# Patient Record
Sex: Female | Born: 1955 | Race: Black or African American | Hispanic: No | Smoking: Current every day smoker
Health system: Southern US, Community
[De-identification: ages and names within clinical notes are randomized; demographics above are authoritative.]

## PROBLEM LIST (undated history)

## (undated) DIAGNOSIS — Z972 Presence of dental prosthetic device (complete) (partial): Secondary | ICD-10-CM

## (undated) DIAGNOSIS — G250 Essential tremor: Principal | ICD-10-CM

## (undated) DIAGNOSIS — H269 Unspecified cataract: Secondary | ICD-10-CM

## (undated) DIAGNOSIS — R251 Tremor, unspecified: Secondary | ICD-10-CM

## (undated) DIAGNOSIS — K08109 Complete loss of teeth, unspecified cause, unspecified class: Secondary | ICD-10-CM

## (undated) DIAGNOSIS — R2240 Localized swelling, mass and lump, unspecified lower limb: Secondary | ICD-10-CM

## (undated) DIAGNOSIS — F419 Anxiety disorder, unspecified: Secondary | ICD-10-CM

## (undated) DIAGNOSIS — F32A Depression, unspecified: Secondary | ICD-10-CM

## (undated) DIAGNOSIS — R05 Cough: Secondary | ICD-10-CM

## (undated) DIAGNOSIS — J45909 Unspecified asthma, uncomplicated: Secondary | ICD-10-CM

## (undated) DIAGNOSIS — G4730 Sleep apnea, unspecified: Secondary | ICD-10-CM

## (undated) DIAGNOSIS — G252 Other specified forms of tremor: Principal | ICD-10-CM

## (undated) DIAGNOSIS — E669 Obesity, unspecified: Secondary | ICD-10-CM

## (undated) DIAGNOSIS — I639 Cerebral infarction, unspecified: Secondary | ICD-10-CM

## (undated) DIAGNOSIS — M1990 Unspecified osteoarthritis, unspecified site: Secondary | ICD-10-CM

## (undated) DIAGNOSIS — R350 Frequency of micturition: Secondary | ICD-10-CM

## (undated) DIAGNOSIS — R0600 Dyspnea, unspecified: Secondary | ICD-10-CM

## (undated) DIAGNOSIS — N3281 Overactive bladder: Secondary | ICD-10-CM

## (undated) DIAGNOSIS — R413 Other amnesia: Secondary | ICD-10-CM

## (undated) HISTORY — DX: Other specified forms of tremor: G25.2

## (undated) HISTORY — DX: Anxiety disorder, unspecified: F41.9

## (undated) HISTORY — PX: JOINT REPLACEMENT: SHX530

## (undated) HISTORY — DX: Unspecified cataract: H26.9

## (undated) HISTORY — PX: OTHER SURGICAL HISTORY: SHX169

## (undated) HISTORY — DX: Depression, unspecified: F32.A

## (undated) HISTORY — DX: Other amnesia: R41.3

## (undated) HISTORY — DX: Obesity, unspecified: E66.9

## (undated) HISTORY — DX: Essential tremor: G25.0

## (undated) HISTORY — PX: IRIDOTOMY / IRIDECTOMY: SHX165

---

## 1980-04-16 HISTORY — PX: TUBAL LIGATION: SHX77

## 2010-12-16 ENCOUNTER — Emergency Department (HOSPITAL_COMMUNITY): Payer: Self-pay

## 2010-12-16 ENCOUNTER — Emergency Department (HOSPITAL_COMMUNITY)
Admission: EM | Admit: 2010-12-16 | Discharge: 2010-12-16 | Disposition: A | Discharge status: Home / Self Care | Payer: Self-pay | Attending: Emergency Medicine | Admitting: Emergency Medicine

## 2010-12-16 DIAGNOSIS — J45909 Unspecified asthma, uncomplicated: Secondary | ICD-10-CM | POA: Insufficient documentation

## 2010-12-16 DIAGNOSIS — F17200 Nicotine dependence, unspecified, uncomplicated: Secondary | ICD-10-CM | POA: Insufficient documentation

## 2010-12-16 DIAGNOSIS — R059 Cough, unspecified: Secondary | ICD-10-CM | POA: Insufficient documentation

## 2010-12-16 DIAGNOSIS — R05 Cough: Secondary | ICD-10-CM | POA: Insufficient documentation

## 2012-06-24 ENCOUNTER — Other Ambulatory Visit: Payer: Self-pay

## 2012-06-24 DIAGNOSIS — Z1231 Encounter for screening mammogram for malignant neoplasm of breast: Secondary | ICD-10-CM

## 2012-07-11 ENCOUNTER — Ambulatory Visit
Admission: RE | Admit: 2012-07-11 | Discharge: 2012-07-11 | Disposition: A | Discharge status: Home / Self Care | Payer: Self-pay | Source: Ambulatory Visit

## 2012-07-11 DIAGNOSIS — Z1231 Encounter for screening mammogram for malignant neoplasm of breast: Secondary | ICD-10-CM

## 2012-07-15 ENCOUNTER — Encounter (HOSPITAL_BASED_OUTPATIENT_CLINIC_OR_DEPARTMENT_OTHER): Payer: Self-pay | Admitting: *Deleted

## 2012-07-15 DIAGNOSIS — R059 Cough, unspecified: Secondary | ICD-10-CM

## 2012-07-15 DIAGNOSIS — R2240 Localized swelling, mass and lump, unspecified lower limb: Secondary | ICD-10-CM

## 2012-07-15 HISTORY — DX: Localized swelling, mass and lump, unspecified lower limb: R22.40

## 2012-07-15 HISTORY — DX: Cough, unspecified: R05.9

## 2012-07-21 ENCOUNTER — Encounter (HOSPITAL_BASED_OUTPATIENT_CLINIC_OR_DEPARTMENT_OTHER): Payer: Self-pay | Admitting: Anesthesiology

## 2012-07-21 ENCOUNTER — Encounter (HOSPITAL_BASED_OUTPATIENT_CLINIC_OR_DEPARTMENT_OTHER)
Admission: RE | Disposition: A | Discharge status: Home / Self Care | Payer: Self-pay | Source: Ambulatory Visit | Attending: Specialist

## 2012-07-21 ENCOUNTER — Ambulatory Visit (HOSPITAL_BASED_OUTPATIENT_CLINIC_OR_DEPARTMENT_OTHER): Payer: Medicaid Other | Admitting: Anesthesiology

## 2012-07-21 ENCOUNTER — Ambulatory Visit (HOSPITAL_BASED_OUTPATIENT_CLINIC_OR_DEPARTMENT_OTHER)
Admission: RE | Admit: 2012-07-21 | Discharge: 2012-07-21 | Disposition: A | Discharge status: Home / Self Care | Payer: Medicaid Other | Source: Ambulatory Visit | Attending: Specialist | Admitting: Specialist

## 2012-07-21 DIAGNOSIS — Z6841 Body Mass Index (BMI) 40.0 and over, adult: Secondary | ICD-10-CM | POA: Insufficient documentation

## 2012-07-21 DIAGNOSIS — J45909 Unspecified asthma, uncomplicated: Secondary | ICD-10-CM | POA: Insufficient documentation

## 2012-07-21 DIAGNOSIS — I69959 Hemiplegia and hemiparesis following unspecified cerebrovascular disease affecting unspecified side: Secondary | ICD-10-CM | POA: Insufficient documentation

## 2012-07-21 DIAGNOSIS — D1739 Benign lipomatous neoplasm of skin and subcutaneous tissue of other sites: Secondary | ICD-10-CM | POA: Insufficient documentation

## 2012-07-21 DIAGNOSIS — N318 Other neuromuscular dysfunction of bladder: Secondary | ICD-10-CM | POA: Insufficient documentation

## 2012-07-21 DIAGNOSIS — M129 Arthropathy, unspecified: Secondary | ICD-10-CM | POA: Insufficient documentation

## 2012-07-21 DIAGNOSIS — G709 Myoneural disorder, unspecified: Secondary | ICD-10-CM | POA: Insufficient documentation

## 2012-07-21 DIAGNOSIS — Z79899 Other long term (current) drug therapy: Secondary | ICD-10-CM | POA: Insufficient documentation

## 2012-07-21 DIAGNOSIS — Z791 Long term (current) use of non-steroidal anti-inflammatories (NSAID): Secondary | ICD-10-CM | POA: Insufficient documentation

## 2012-07-21 DIAGNOSIS — F17200 Nicotine dependence, unspecified, uncomplicated: Secondary | ICD-10-CM | POA: Insufficient documentation

## 2012-07-21 HISTORY — DX: Cerebral infarction, unspecified: I63.9

## 2012-07-21 HISTORY — DX: Unspecified osteoarthritis, unspecified site: M19.90

## 2012-07-21 HISTORY — DX: Overactive bladder: N32.81

## 2012-07-21 HISTORY — PX: MASS EXCISION: SHX2000

## 2012-07-21 HISTORY — DX: Tremor, unspecified: R25.1

## 2012-07-21 HISTORY — DX: Cough: R05

## 2012-07-21 HISTORY — DX: Frequency of micturition: R35.0

## 2012-07-21 HISTORY — DX: Presence of dental prosthetic device (complete) (partial): Z97.2

## 2012-07-21 HISTORY — DX: Complete loss of teeth, unspecified cause, unspecified class: K08.109

## 2012-07-21 HISTORY — DX: Unspecified asthma, uncomplicated: J45.909

## 2012-07-21 HISTORY — DX: Localized swelling, mass and lump, unspecified lower limb: R22.40

## 2012-07-21 LAB — POCT HEMOGLOBIN-HEMACUE: Hemoglobin: 13.2 g/dL (ref 12.0–15.0)

## 2012-07-21 SURGERY — EXCISION MASS
Anesthesia: General | Site: Knee | Laterality: Left | Wound class: Clean

## 2012-07-21 MED ORDER — FENTANYL CITRATE 0.05 MG/ML IJ SOLN: 50.0000 ug | INTRAMUSCULAR | Status: DC | PRN

## 2012-07-21 MED ORDER — CEFAZOLIN SODIUM-DEXTROSE 2-3 GM-% IV SOLR
2.0000 g | INTRAVENOUS | Status: AC
  Administered 2012-07-21: 2 g via INTRAVENOUS

## 2012-07-21 MED ORDER — DEXAMETHASONE SODIUM PHOSPHATE 4 MG/ML IJ SOLN
INTRAMUSCULAR | Status: DC | PRN
  Administered 2012-07-21: 5 mg via INTRAVENOUS

## 2012-07-21 MED ORDER — MIDAZOLAM HCL 2 MG/2ML IJ SOLN: 1.0000 mg | INTRAMUSCULAR | Status: DC | PRN

## 2012-07-21 MED ORDER — OXYCODONE HCL 5 MG PO TABS
5.0000 mg | ORAL_TABLET | Freq: Once | ORAL | Status: AC | PRN
  Administered 2012-07-21: 5 mg via ORAL

## 2012-07-21 MED ORDER — MIDAZOLAM HCL 2 MG/ML PO SYRP: 0.5000 mg/kg | ORAL_SOLUTION | Freq: Once | ORAL | Status: DC | PRN

## 2012-07-21 MED ORDER — LIDOCAINE HCL (CARDIAC) 20 MG/ML IV SOLN
INTRAVENOUS | Status: DC | PRN
  Administered 2012-07-21: 60 mg via INTRAVENOUS

## 2012-07-21 MED ORDER — ONDANSETRON HCL 4 MG/2ML IJ SOLN
INTRAMUSCULAR | Status: DC | PRN
  Administered 2012-07-21: 4 mg via INTRAVENOUS

## 2012-07-21 MED ORDER — SODIUM CHLORIDE 0.9 % IV SOLN
INTRAVENOUS | Status: DC | PRN
  Administered 2012-07-21: 200 mL via INTRAMUSCULAR

## 2012-07-21 MED ORDER — HYDROMORPHONE HCL PF 1 MG/ML IJ SOLN: 0.2500 mg | INTRAMUSCULAR | Status: DC | PRN

## 2012-07-21 MED ORDER — PROPOFOL 10 MG/ML IV BOLUS
INTRAVENOUS | Status: DC | PRN
  Administered 2012-07-21: 200 mg via INTRAVENOUS

## 2012-07-21 MED ORDER — FENTANYL CITRATE 0.05 MG/ML IJ SOLN
INTRAMUSCULAR | Status: DC | PRN
  Administered 2012-07-21: 100 ug via INTRAVENOUS
  Administered 2012-07-21: 50 ug via INTRAVENOUS

## 2012-07-21 MED ORDER — LACTATED RINGERS IV SOLN
INTRAVENOUS | Status: DC
  Administered 2012-07-21: 10:00:00 via INTRAVENOUS

## 2012-07-21 MED ORDER — LIDOCAINE-EPINEPHRINE 0.5 %-1:200000 IJ SOLN
INTRAMUSCULAR | Status: DC | PRN
  Administered 2012-07-21: 100 mL

## 2012-07-21 MED ORDER — OXYCODONE HCL 5 MG/5ML PO SOLN: 5.0000 mg | Freq: Once | ORAL | Status: AC | PRN

## 2012-07-21 MED ORDER — MIDAZOLAM HCL 5 MG/5ML IJ SOLN
INTRAMUSCULAR | Status: DC | PRN
  Administered 2012-07-21: 2 mg via INTRAVENOUS

## 2012-07-21 SURGICAL SUPPLY — 61 items
APL SKNCLS STERI-STRIP NONHPOA (GAUZE/BANDAGES/DRESSINGS)
BAG DECANTER FOR FLEXI CONT (MISCELLANEOUS) ×2 IMPLANT
BALL CTTN LRG ABS STRL LF (GAUZE/BANDAGES/DRESSINGS)
BANDAGE ELASTIC 4 VELCRO ST LF (GAUZE/BANDAGES/DRESSINGS) IMPLANT
BANDAGE ELASTIC 6 VELCRO ST LF (GAUZE/BANDAGES/DRESSINGS) ×1 IMPLANT
BANDAGE GAUZE ELAST BULKY 4 IN (GAUZE/BANDAGES/DRESSINGS) ×2 IMPLANT
BENZOIN TINCTURE PRP APPL 2/3 (GAUZE/BANDAGES/DRESSINGS) IMPLANT
BLADE KNIFE PERSONA 10 (BLADE) ×2 IMPLANT
BLADE KNIFE PERSONA 15 (BLADE) ×2 IMPLANT
BNDG COHESIVE 4X5 TAN STRL (GAUZE/BANDAGES/DRESSINGS) IMPLANT
CANISTER SUCTION 1200CC (MISCELLANEOUS) ×1 IMPLANT
CLEANER CAUTERY TIP 5X5 PAD (MISCELLANEOUS) ×1 IMPLANT
CLOTH BEACON ORANGE TIMEOUT ST (SAFETY) ×2 IMPLANT
COTTONBALL LRG STERILE PKG (GAUZE/BANDAGES/DRESSINGS) IMPLANT
COVER MAYO STAND STRL (DRAPES) ×2 IMPLANT
COVER TABLE BACK 60X90 (DRAPES) ×2 IMPLANT
DRAPE LAPAROSCOPIC ABDOMINAL (DRAPES) ×1 IMPLANT
DRAPE U-SHAPE 76X120 STRL (DRAPES) IMPLANT
DRSG PAD ABDOMINAL 8X10 ST (GAUZE/BANDAGES/DRESSINGS) IMPLANT
ELECT NDL TIP 2.8 STRL (NEEDLE) ×1 IMPLANT
ELECT NEEDLE TIP 2.8 STRL (NEEDLE) ×2 IMPLANT
ELECT REM PT RETURN 9FT ADLT (ELECTROSURGICAL) ×2
ELECTRODE REM PT RTRN 9FT ADLT (ELECTROSURGICAL) ×1 IMPLANT
FILTER 7/8 IN (FILTER) IMPLANT
GAUZE SPONGE 4X4 12PLY STRL LF (GAUZE/BANDAGES/DRESSINGS) IMPLANT
GAUZE SPONGE 4X4 16PLY XRAY LF (GAUZE/BANDAGES/DRESSINGS) IMPLANT
GAUZE XEROFORM 1X8 LF (GAUZE/BANDAGES/DRESSINGS) ×2 IMPLANT
GAUZE XEROFORM 5X9 LF (GAUZE/BANDAGES/DRESSINGS) IMPLANT
GLOVE BIO SURGEON STRL SZ 6.5 (GLOVE) ×2 IMPLANT
GLOVE BIOGEL M STRL SZ7.5 (GLOVE) ×2 IMPLANT
GLOVE BIOGEL PI IND STRL 8 (GLOVE) ×1 IMPLANT
GLOVE BIOGEL PI INDICATOR 8 (GLOVE) ×1
GLOVE ECLIPSE 7.0 STRL STRAW (GLOVE) ×2 IMPLANT
GOWN PREVENTION PLUS XLARGE (GOWN DISPOSABLE) IMPLANT
GOWN PREVENTION PLUS XXLARGE (GOWN DISPOSABLE) ×3 IMPLANT
NDL HYPO 25X1 1.5 SAFETY (NEEDLE) ×1 IMPLANT
NEEDLE HYPO 25X1 1.5 SAFETY (NEEDLE) ×2 IMPLANT
PACK BASIN DAY SURGERY FS (CUSTOM PROCEDURE TRAY) ×2 IMPLANT
PAD CLEANER CAUTERY TIP 5X5 (MISCELLANEOUS)
PENCIL BUTTON HOLSTER BLD 10FT (ELECTRODE) ×1 IMPLANT
SET ASPIRATION TUBING (TUBING) ×1 IMPLANT
SHEET MEDIUM DRAPE 40X70 STRL (DRAPES) ×1 IMPLANT
SHEETING SILICONE GEL EPI DERM (MISCELLANEOUS) IMPLANT
SPONGE GAUZE 4X4 12PLY (GAUZE/BANDAGES/DRESSINGS) IMPLANT
SPONGE LAP 18X18 X RAY DECT (DISPOSABLE) ×2 IMPLANT
STAPLER VISISTAT 35W (STAPLE) IMPLANT
STOCKINETTE 4X48 STRL (DRAPES) IMPLANT
STRIP CLOSURE SKIN 1/2X4 (GAUZE/BANDAGES/DRESSINGS) ×1 IMPLANT
SUCTION FRAZIER TIP 10 FR DISP (SUCTIONS) IMPLANT
SUT MNCRL AB 3-0 PS2 18 (SUTURE) IMPLANT
SUT PROLENE 4 0 P 3 18 (SUTURE) IMPLANT
SUT PROLENE 4 0 PS 2 18 (SUTURE) IMPLANT
SUT SILK 3 0 PS 1 (SUTURE) IMPLANT
SYR CONTROL 10ML LL (SYRINGE) ×2 IMPLANT
TAPE HYPAFIX 6X30 (GAUZE/BANDAGES/DRESSINGS) IMPLANT
TOWEL OR 17X24 6PK STRL BLUE (TOWEL DISPOSABLE) ×4 IMPLANT
TRAY DSU PREP LF (CUSTOM PROCEDURE TRAY) ×2 IMPLANT
TUBE CONNECTING 20X1/4 (TUBING) ×1 IMPLANT
UNDERPAD 30X30 INCONTINENT (UNDERPADS AND DIAPERS) ×1 IMPLANT
VAC PENCILS W/TUBING CLEAR (MISCELLANEOUS) IMPLANT
YANKAUER SUCT BULB TIP NO VENT (SUCTIONS) ×2 IMPLANT

## 2012-07-21 NOTE — Anesthesia Preprocedure Evaluation (Addendum)
Anesthesia Evaluation  Patient identified by MRN, date of birth, ID band Patient awake    Reviewed: Allergy & Precautions, H&P , NPO status , Patient's Chart, lab work & pertinent test results  Airway Mallampati: II TM Distance: >3 FB Neck ROM: Full    Dental no notable dental hx. (+) Edentulous Upper, Edentulous Lower and Dental Advisory Given   Pulmonary asthma ,  breath sounds clear to auscultation  Pulmonary exam normal       Cardiovascular negative cardio ROS  Rhythm:Regular Rate:Normal     Neuro/Psych  Neuromuscular disease CVA, Residual Symptoms negative psych ROS   GI/Hepatic negative GI ROS, Neg liver ROS,   Endo/Other  negative endocrine ROSMorbid obesity  Renal/GU negative Renal ROS  negative genitourinary   Musculoskeletal   Abdominal   Peds  Hematology negative hematology ROS (+)   Anesthesia Other Findings   Reproductive/Obstetrics negative OB ROS                          Anesthesia Physical Anesthesia Plan  ASA: III  Anesthesia Plan: General   Post-op Pain Management:    Induction: Intravenous  Airway Management Planned: LMA  Additional Equipment:   Intra-op Plan:   Post-operative Plan: Extubation in OR  Informed Consent: I have reviewed the patients History and Physical, chart, labs and discussed the procedure including the risks, benefits and alternatives for the proposed anesthesia with the patient or authorized representative who has indicated his/her understanding and acceptance.   Dental advisory given  Plan Discussed with: CRNA  Anesthesia Plan Comments:         Anesthesia Quick Evaluation

## 2012-07-21 NOTE — Anesthesia Procedure Notes (Signed)
Procedure Name: LMA Insertion Performed by: Muzammil Bruins W Pre-anesthesia Checklist: Patient identified, Timeout performed, Emergency Drugs available, Suction available and Patient being monitored Patient Re-evaluated:Patient Re-evaluated prior to inductionOxygen Delivery Method: Circle system utilized Preoxygenation: Pre-oxygenation with 100% oxygen Intubation Type: IV induction Ventilation: Mask ventilation without difficulty LMA: LMA with gastric port inserted LMA Size: 4.0 Number of attempts: 1 Placement Confirmation: breath sounds checked- equal and bilateral and positive ETCO2 Tube secured with: Tape Dental Injury: Teeth and Oropharynx as per pre-operative assessment      

## 2012-07-21 NOTE — Anesthesia Postprocedure Evaluation (Signed)
  Anesthesia Post-op Note  Patient: Jasmine Estrada  Procedure(s) Performed: Procedure(s): EXCISION OF LARGE MASS LEFT KNEE WITH LIPOSUCTION ASSISTED (Left)  Patient Location: PACU  Anesthesia Type:General  Level of Consciousness: awake and alert   Airway and Oxygen Therapy: Patient Spontanous Breathing  Post-op Pain: mild  Post-op Assessment: Post-op Vital signs reviewed, Patient's Cardiovascular Status Stable, Respiratory Function Stable, Patent Airway and No signs of Nausea or vomiting  Post-op Vital Signs: Reviewed and stable  Complications: No apparent anesthesia complications

## 2012-07-21 NOTE — Discharge Instructions (Signed)
Activity (include date of return to work if known) °As tolerated: NO showers °NO driving °No heavy activities ° °Diet:regular No restrictions: ° °Wound Care: Keep dressing clean & dry ° °Don not change dressings °For Abdominoplasties wear abdominal binder °Special Instructions: °Do not raise arms up °Continue to empty, recharge, & record drainage from J-P drains &/or °Hemovacs 2-3 times a day, as needed. °Call Doctor if any unusual problems occur such as pain, excessive °Bleeding, unrelieved Nausea/vomiting, Fever &/or chills °When lying down, keep head elevated on 2-3 pillows or back-rest °For Addominoplasties the Jack-knife position °Follow-up appointment: Please call the office. ° °The patient received discharge instruction from:___________________________________________ ° ° ° ° ° °Patient signature ________________________________________ / Date___________ ° ° ° °Signature of individual providing instructions/ Date________________             ° ° °Post Anesthesia Home Care Instructions ° °Activity: °Get plenty of rest for the remainder of the day. A responsible adult should stay with you for 24 hours following the procedure.  °For the next 24 hours, DO NOT: °-Drive a car °-Operate machinery °-Drink alcoholic beverages °-Take any medication unless instructed by your physician °-Make any legal decisions or sign important papers. ° °Meals: °Start with liquid foods such as gelatin or soup. Progress to regular foods as tolerated. Avoid greasy, spicy, heavy foods. If nausea and/or vomiting occur, drink only clear liquids until the nausea and/or vomiting subsides. Call your physician if vomiting continues. ° °Special Instructions/Symptoms: °Your throat may feel dry or sore from the anesthesia or the breathing tube placed in your throat during surgery. If this causes discomfort, gargle with warm salt water. The discomfort should disappear within 24 hours. ° °

## 2012-07-21 NOTE — Brief Op Note (Signed)
07/21/2012  11:21 AM  PATIENT:  Jasmine Estrada  57 y.o. female  PRE-OPERATIVE DIAGNOSIS:  LARGE MASS LEFT KNEE  POST-OPERATIVE DIAGNOSIS:  LARGE MASS LEFT KNEE  PROCEDURE:  Procedure(s): EXCISION OF LARGE MASS LEFT KNEE WITH LIPOSUCTION ASSISTED (Left)  SURGEON:  Surgeon(s) and Role:    * Louisa Second, MD - Primary  PHYSICIAN ASSISTANT:   ASSISTANTS: none   ANESTHESIA:   general  EBL:  Total I/O In: 1000 [I.V.:1000] Out: -   BLOOD ADMINISTERED:none  DRAINS: none   LOCAL MEDICATIONS USED:  LIDOCAINE   SPECIMEN:  Excision  DISPOSITION OF SPECIMEN:  PATHOLOGY  COUNTS:  YES  TOURNIQUET:  * No tourniquets in log *  DICTATION: .Other Dictation: Dictation Number [id number]  PLAN OF CARE: Discharge to home after PACU  PATIENT DISPOSITION:  PACU - hemodynamically stable.   Delay start of Pharmacological VTE agent (>24hrs) due to surgical blood loss or risk of bleeding: yes

## 2012-07-21 NOTE — Transfer of Care (Signed)
Immediate Anesthesia Transfer of Care Note  Patient: Jasmine Estrada  Procedure(s) Performed: Procedure(s): EXCISION OF LARGE MASS LEFT KNEE WITH LIPOSUCTION ASSISTED (Left)  Patient Location: PACU  Anesthesia Type:General  Level of Consciousness: awake, alert  and oriented  Airway & Oxygen Therapy: Patient Spontanous Breathing and Patient connected to face mask oxygen  Post-op Assessment: Report given to PACU RN and Post -op Vital signs reviewed and stable  Post vital signs: Reviewed and stable  Complications: No apparent anesthesia complications

## 2012-07-21 NOTE — H&P (Signed)
Jasmine Estrada is an 57 y.o. female.   Chief Complaint: Large mass left thigh HPI: Increased growth and limitation of rom  Past Medical History  Diagnosis Date  . Stroke     right sided-weakness  . Urinary frequency   . Arthritis     legs  . Cough 07/15/2012  . Full dentures   . Tremor     right foot  . Overactive bladder     states suspicion of bladder cancer  . Mass of knee 07/2012    left  . Asthma     prn inhaler    Past Surgical History  Procedure Laterality Date  . Tubal ligation  1982    History reviewed. No pertinent family history. Social History:  reports that she has been smoking Cigarettes.  She has been smoking about 0.00 packs per day for the past 44 years. She has never used smokeless tobacco. She reports that she does not drink alcohol or use illicit drugs.  Allergies: No Known Allergies  Medications Prior to Admission  Medication Sig Dispense Refill  . Aspirin-Acetaminophen-Caffeine (GOODY HEADACHE PO) Take by mouth.      . fesoterodine (TOVIAZ) 8 MG TB24 Take 8 mg by mouth daily.      Marland Kitchen albuterol (PROVENTIL HFA;VENTOLIN HFA) 108 (90 BASE) MCG/ACT inhaler Inhale 2 puffs into the lungs every 6 (six) hours as needed for wheezing.        No results found for this or any previous visit (from the past 48 hour(s)). No results found.  Review of Systems  Constitutional: Negative.   HENT: Negative.   Eyes: Negative.   Respiratory: Negative.   Cardiovascular: Negative.   Gastrointestinal: Negative.   Genitourinary: Negative.   Musculoskeletal: Negative.   Skin: Negative.   Neurological: Negative.   Endo/Heme/Allergies: Negative.   Psychiatric/Behavioral: Negative.     Blood pressure 131/87, pulse 87, temperature 98.6 F (37 C), temperature source Oral, resp. rate 20, height 5\' 7"  (1.702 m), weight 116.178 kg (256 lb 2 oz), SpO2 97.00%. Physical Exam   Assessment/Plan Largr mass left thigh with increased growth  Renji Berwick L 07/21/2012, 10:25  AM

## 2012-07-22 ENCOUNTER — Encounter (HOSPITAL_BASED_OUTPATIENT_CLINIC_OR_DEPARTMENT_OTHER): Payer: Self-pay | Admitting: Specialist

## 2012-07-23 NOTE — Op Note (Signed)
NAMEMONA, AYARS NO.:  [id number]  MEDICAL RECORD NO.:  [id number]  LOCATION:                                 FACILITY:  PHYSICIAN:  Earvin Hansen L. Shon Hough, M.D.DATE OF BIRTH:  [birth year]  DATE OF PROCEDURE:  07/21/2012 DATE OF DISCHARGE:  07/21/2012                              OPERATIVE REPORT   A 57 year old lady who is having a large mass about her left and in the thigh area, increased pain, discomfort, and decreased mobility.  The patient's area is roughly 12 x 10 inches, and she has increased rubbing and intertriginous changes secondary to the mass effect.  PROCEDURES DONE:  Excision, plaster reconstruction of the area with flap.  ANESTHESIA:  General.  Preoperatively, the patient underwent drawings for the mass on the left inner thigh knee area medially.  She underwent general anesthesia, intubated orally, prep was done to the leg, thigh areas with Hibiclens soap and solution, walled off with sterile towels and drapes so as to make a sterile field.  Curvilinear incision was made over the mass, carried down to the skin and subcutaneous tissue using Metzenbaum scissors and dissecting scissors.  We were able to dissect out the mass effect, which was very large down to the underlying musculature fascia. Liposuction was then fashioned to smooth out the edges and to improve contour of the area and then the flaps were rotated and advanced, secured with deep sutures of 2-0 Monocryl x2 layers, subdermal suture of 3-0 Monocryl and a running subcuticular stitch of 3-0 Monocryl.  End of the incision left open for drainage.  Sterile dressing were applied including 4x4s, Steri-Strips, ABDs, Hypafix tape, Ace wraps.  She withstood the procedures very well, was taken to recovery in good condition.  Estimated blood loss less than 100 mL.  Complications none.     Yaakov Guthrie. Shon Hough, M.D.     Cathie Hoops  D:  07/21/2012  T:  07/22/2012  Job:  [id number]

## 2013-01-23 ENCOUNTER — Encounter: Payer: Self-pay | Admitting: Neurology

## 2013-01-26 ENCOUNTER — Encounter: Payer: Self-pay | Admitting: Neurology

## 2013-01-26 ENCOUNTER — Ambulatory Visit (INDEPENDENT_AMBULATORY_CARE_PROVIDER_SITE_OTHER): Payer: Medicaid Other | Admitting: Neurology

## 2013-01-26 VITALS — BP 117/77 | HR 90 | Ht 66.0 in | Wt 244.0 lb

## 2013-01-26 DIAGNOSIS — G250 Essential tremor: Secondary | ICD-10-CM

## 2013-01-26 DIAGNOSIS — G252 Other specified forms of tremor: Secondary | ICD-10-CM | POA: Insufficient documentation

## 2013-01-26 DIAGNOSIS — R413 Other amnesia: Secondary | ICD-10-CM

## 2013-01-26 HISTORY — DX: Other amnesia: R41.3

## 2013-01-26 HISTORY — DX: Essential tremor: G25.0

## 2013-01-26 NOTE — Progress Notes (Signed)
Reason for visit: Tremor  Jasmine Estrada is a 57 y.o. female  History of present illness:  Ms. Jasmine Estrada is a 64 year old right-handed black female with a 14 year history of tremors. The patient indicates that the tremors mainly involve her right lower extremity, but there is also an associated discomfort that affects the leg that is present at all times. The patient may have an achy sensation, and a crawling sensation on the leg. The patient walks on her heel on the right leg, and she indicates that she has done this "all of her life". The patient has had tremors involving the upper extremities as well, but over the last 2 months, the tremors have gone away. The patient has been told that she had a stroke approximately a year ago. The patient uses a cane for ambulation, and she will occasionally use a walker. The patient has had some urinary incontinence over the last one year, and some memory problems that have developed over the last 2 years. The patient reports no numbness of the extremities, and she feels "weak all over" with fatigue. The patient reports no headaches or visual field changes. The patient denies problems with speech or swallowing. The patient is sent to this office for an evaluation of the tremor.  Past Medical History  Diagnosis Date  . Stroke     right sided-weakness  . Urinary frequency   . Arthritis     legs  . Cough 07/15/2012  . Full dentures   . Tremor     right foot  . Overactive bladder     states suspicion of bladder cancer  . Mass of knee 07/2012    left  . Asthma     prn inhaler  . Essential and other specified forms of tremor 01/26/2013  . Memory difficulty 01/26/2013  . Obesity     Past Surgical History  Procedure Laterality Date  . Tubal ligation  1982  . Mass excision Left 07/21/2012    Procedure: EXCISION OF LARGE MASS LEFT KNEE WITH LIPOSUCTION ASSISTED;  Surgeon: Louisa Second, MD;  Location: [facility];  Service: Plastics;   Laterality: Left;    Family History  Problem Relation Age of Onset  . Cancer Sister   . Diabetes Sister   . Mental illness Brother   . Alzheimer's disease Maternal Aunt   . Alzheimer's disease Maternal Grandmother   . Mental illness Sister     Social history:  reports that she has been smoking Cigarettes.  She has a 22 pack-year smoking history. She has never used smokeless tobacco. She reports that she does not drink alcohol or use illicit drugs.  Medications:  Current Outpatient Prescriptions on File Prior to Visit  Medication Sig Dispense Refill  . albuterol (PROVENTIL HFA;VENTOLIN HFA) 108 (90 BASE) MCG/ACT inhaler Inhale 2 puffs into the lungs every 6 (six) hours as needed for wheezing.      . solifenacin (VESICARE) 5 MG tablet Take 10 mg by mouth daily.       No current facility-administered medications on file prior to visit.     No Known Allergies  ROS:  Out of a complete 14 system review of symptoms, the patient complains only of the following symptoms, and all other reviewed systems are negative.  Fatigue Cough Incontinence of bladder Easy bruising Memory loss, tremor Restless legs Achy muscles  Blood pressure 117/77, pulse 90, height 5\' 6"  (1.676 m), weight 244 lb (110.678 kg).  Physical Exam  General:  The patient is alert and cooperative at the time of the examination. The patient is moderately obese.  Head: Pupils are equal, round, and reactive to light. Discs are flat bilaterally.  Neck: The neck is supple, no carotid bruits are noted.  Respiratory: The respiratory examination is clear.  Cardiovascular: The cardiovascular examination reveals a regular rate and rhythm, no obvious murmurs or rubs are noted.  Skin: Extremities are without significant edema.  Neurologic Exam  Mental status:  Cranial nerves: Facial symmetry is present. There is good sensation of the face to pinprick and soft touch bilaterally. The strength of the facial muscles and  the muscles to head turning and shoulder shrug are normal bilaterally. Speech is well enunciated, no aphasia or dysarthria is noted. Extraocular movements are full. Visual fields are full.  Motor: The motor testing reveals 5 over 5 strength of all 4 extremities. Good symmetric motor tone is noted throughout.  Sensory: Sensory testing is intact to pinprick, soft touch, vibration sensation, and position sense on all 4 extremities, with the exception that pinprick sensation is decreased on the right arm and right leg. No evidence of extinction is noted.  Coordination: Cerebellar testing reveals good finger-nose-finger and heel-to-shin bilaterally. The patient does have tremors affecting the fourth and fifth toes of the right foot, mild tremors of the fingers of the hands, right greater than left, and an intermittent adductor tremor involving the legs.  Gait and station: Gait is associated with a limping type gait, the patient walks on heel on the right foot. The patient uses a cane for ambulation. Tandem gait is slightly unsteady. Romberg is negative. No drift is seen.  Reflexes: Deep tendon reflexes are symmetric and normal bilaterally. Toes are downgoing bilaterally.   Assessment/Plan:  1. Tremor  2. Gait disorder  3. Memory disorder  The patient appears to have an unusual tremor that dates back 14 years. The patient does have mild tremors mainly affecting the fingers of the hands, right greater than left, and of the lateral toes of the right foot, with an adductor tremor of the legs. The patient reports a progressive memory problem as well. The patient walks with a cane. The patient will be set up for blood work today, and she will undergo MRI evaluation of the brain and an EEG study. A second opinion from a movement disorder specialist may be indicated. Both of this patient's parents died early, there is no family history of tremor. The possibility of Huntington's disease or Wilson's disease  should be considered.  Marlan Palau MD 01/26/2013 7:30 PM  [facility] [address] [place], [place] [address]  Phone [phone] Fax [phone]

## 2013-02-16 ENCOUNTER — Telehealth: Payer: Self-pay | Admitting: Neurology

## 2013-02-16 NOTE — Telephone Encounter (Signed)
Called and left VM message for clarification of paperwork

## 2013-02-17 NOTE — Telephone Encounter (Signed)
I called the patient. I left a message. I do not understand her message about canceling EEG. I will contact her once the MRI the brain is done.

## 2013-02-18 ENCOUNTER — Other Ambulatory Visit: Payer: Medicaid Other

## 2013-02-25 ENCOUNTER — Telehealth: Payer: Self-pay | Admitting: *Deleted

## 2013-02-25 DIAGNOSIS — Z79899 Other long term (current) drug therapy: Secondary | ICD-10-CM

## 2013-02-25 NOTE — Telephone Encounter (Signed)
Spoke with patient and she was unaware of the blood work order, had not been scheduled for the EEG, MRI was denied but per Renee(insurance needed labs) patient will come in today and have labs, EEG will be scheduled

## 2013-02-25 NOTE — Telephone Encounter (Signed)
Pt states cannot get pharmacy to fill her Lamisil. I informed pt that she needed to have another Hepatic Panel bloodwork.  Pt agreed to pick up the lab orders on 02/26/2013 and have it drawn at her Dr Anne Hahn office.

## 2013-02-26 ENCOUNTER — Other Ambulatory Visit: Payer: Medicaid Other

## 2013-02-27 ENCOUNTER — Telehealth: Payer: Self-pay | Admitting: Neurology

## 2013-03-03 ENCOUNTER — Telehealth: Payer: Self-pay | Admitting: *Deleted

## 2013-03-03 NOTE — Telephone Encounter (Addendum)
Pt states unable to have bloodwork drawn, because she was told that she did not have Medicaid coverage this month.  Pt is to have hepatic function panel for Lamisil therapy.  Contacted pt 03/04/2013, informed that Dr Al Corpus stated continue Lamisil and get labs drawn next month.  Pt states understanding.

## 2013-03-03 NOTE — Telephone Encounter (Signed)
Call her and have it performed next month if she wants to continue with lamisil

## 2013-04-02 ENCOUNTER — Other Ambulatory Visit (INDEPENDENT_AMBULATORY_CARE_PROVIDER_SITE_OTHER): Payer: Self-pay

## 2013-04-02 DIAGNOSIS — Z0289 Encounter for other administrative examinations: Secondary | ICD-10-CM

## 2013-04-02 LAB — HEPATIC FUNCTION PANEL
ALT: 14 U/L (ref 0–35)
AST: 15 U/L (ref 0–37)
Albumin: 4.2 g/dL (ref 3.5–5.2)
Alkaline Phosphatase: 129 U/L — ABNORMAL HIGH (ref 39–117)
Bilirubin, Direct: 0.1 mg/dL (ref 0.0–0.3)
Indirect Bilirubin: 0.2 mg/dL (ref 0.0–0.9)
Total Bilirubin: 0.3 mg/dL (ref 0.3–1.2)
Total Protein: 7.1 g/dL (ref 6.0–8.3)

## 2013-04-03 ENCOUNTER — Telehealth: Payer: Self-pay | Admitting: *Deleted

## 2013-04-03 ENCOUNTER — Telehealth: Payer: Self-pay | Admitting: Neurology

## 2013-04-03 LAB — COMPREHENSIVE METABOLIC PANEL
ALT: 16 IU/L (ref 0–32)
AST: 16 IU/L (ref 0–40)
Albumin/Globulin Ratio: 1.6 (ref 1.1–2.5)
Albumin: 4.3 g/dL (ref 3.5–5.5)
Alkaline Phosphatase: 131 IU/L — ABNORMAL HIGH (ref 39–117)
BUN/Creatinine Ratio: 14 (ref 9–23)
BUN: 13 mg/dL (ref 6–24)
CO2: 23 mmol/L (ref 18–29)
Calcium: 10.2 mg/dL (ref 8.7–10.2)
Chloride: 103 mmol/L (ref 97–108)
Creatinine, Ser: 0.9 mg/dL (ref 0.57–1.00)
GFR calc Af Amer: 83 mL/min/{1.73_m2} (ref >59)
GFR calc non Af Amer: 72 mL/min/{1.73_m2} (ref >59)
Globulin, Total: 2.7 g/dL (ref 1.5–4.5)
Glucose: 104 mg/dL — ABNORMAL HIGH (ref 65–99)
Potassium: 4.1 mmol/L (ref 3.5–5.2)
Sodium: 141 mmol/L (ref 134–144)
Total Bilirubin: 0.2 mg/dL (ref 0.0–1.2)
Total Protein: 7 g/dL (ref 6.0–8.5)

## 2013-04-03 LAB — VITAMIN B12: Vitamin B-12: 387 pg/mL (ref 211–946)

## 2013-04-03 LAB — HIV ANTIBODY (ROUTINE TESTING W REFLEX)
HIV 1/O/2 Abs-Index Value: <1 (ref <1.00)
HIV-1/HIV-2 Ab: NONREACTIVE

## 2013-04-03 LAB — RPR: RPR: NONREACTIVE

## 2013-04-03 LAB — TSH: TSH: 0.491 u[IU]/mL (ref 0.450–4.500)

## 2013-04-03 LAB — AMMONIA: Ammonia: 63 ug/dL (ref 19–87)

## 2013-04-03 NOTE — Telephone Encounter (Signed)
I left a message with Dr Faylene Million orders.

## 2013-04-03 NOTE — Telephone Encounter (Signed)
I called patient. The comprehensive metabolic profile showed a mild elevation in alkaline phosphatase, otherwise unremarkable. This should be followed over time, but is not concerning at this time.

## 2013-04-03 NOTE — Telephone Encounter (Signed)
Message copied by Marissa Nestle on Fri Apr 03, 2013  4:58 PM ------      Message from: Delories Heinz      Created: Fri Apr 03, 2013  1:33 PM      Regarding: labs       [facility] Labs look great--             Please let her know everything is normal.            Thanks!                  ----- Message -----         From: [facility] Interface         Sent: 04/03/2013   9:25 AM           To: Marlowe Aschoff, DPM                   ------

## 2013-04-15 ENCOUNTER — Telehealth: Payer: Self-pay | Admitting: *Deleted

## 2013-04-15 NOTE — Telephone Encounter (Signed)
Dr Irving Shows states pt's lab of 04/02/2013 are within normal limits, and can begin treatment as instructed.  I left a message with orders on [phone].

## 2013-05-21 ENCOUNTER — Telehealth: Payer: Self-pay | Admitting: *Deleted

## 2013-05-21 ENCOUNTER — Telehealth: Payer: Self-pay | Admitting: Neurology

## 2013-05-21 NOTE — Telephone Encounter (Signed)
Patient called wanting to be seen by Dr. Jannifer Franklin soon because her feet have been hurting and she has been getting worse. Please call patient.

## 2013-05-21 NOTE — Telephone Encounter (Addendum)
Pt states, "I want my pain medication."  Pt states her feet and legs are worse.Dr Valentina Lucks reviewed [place] 12/19/2012, states pt would need to be evaluated prior ordering pain medication, but it appears the pain she is having is neurologic, and would best be treated by her neurologist at [facility].  I informed the pt of Dr Shaune Pollack recommendations and gave the [place] [phone].

## 2013-05-21 NOTE — Telephone Encounter (Signed)
Patient has been scheduled for f/u with Dr Jannifer Franklin for 05/25/13, would like her labs faxed to pcp  As well.

## 2013-05-25 ENCOUNTER — Ambulatory Visit (INDEPENDENT_AMBULATORY_CARE_PROVIDER_SITE_OTHER): Payer: Medicaid Other | Admitting: Neurology

## 2013-05-25 ENCOUNTER — Encounter: Payer: Self-pay | Admitting: Neurology

## 2013-05-25 VITALS — BP 124/82 | HR 86 | Wt 235.0 lb

## 2013-05-25 DIAGNOSIS — G250 Essential tremor: Secondary | ICD-10-CM

## 2013-05-25 DIAGNOSIS — M79609 Pain in unspecified limb: Secondary | ICD-10-CM

## 2013-05-25 DIAGNOSIS — G252 Other specified forms of tremor: Principal | ICD-10-CM

## 2013-05-25 DIAGNOSIS — R413 Other amnesia: Secondary | ICD-10-CM

## 2013-05-25 NOTE — Progress Notes (Signed)
Reason for visit: Tremor, leg pain  Jasmine Estrada is an 58 y.o. female  History of present illness:  Jasmine Estrada is a 58 year old right-handed black female with a history of cerebrovascular disease. The patient indicates that she has discomfort going down the entirety of the right leg, and the pain is present with inactivity, and tends to go away if she walks. The patient is unable sleep because the pain keeps her awake. The patient has some gait instability, and she will fall on occasion. The patient uses a cane or a walker. The patient continues to have tremors that primarily affect the legs. The patient was given gabapentin to take by her primary care physician, but she did not take the medication, as she was concerned about potential side effects. The patient continues to have some memory issues. A MRI study was ordered, but her insurance company would not approve the study. An EEG study was ordered, but once again, the patient could not get the study done. The patient returns for an evaluation.  Past Medical History  Diagnosis Date  . Stroke     right sided-weakness  . Urinary frequency   . Arthritis     legs  . Cough 07/15/2012  . Full dentures   . Tremor     right foot  . Overactive bladder     states suspicion of bladder cancer  . Mass of knee 07/2012    left  . Asthma     prn inhaler  . Essential and other specified forms of tremor 01/26/2013  . Memory difficulty 01/26/2013  . Obesity     Past Surgical History  Procedure Laterality Date  . Tubal ligation  1982  . Mass excision Left 07/21/2012    Procedure: EXCISION OF LARGE MASS LEFT KNEE WITH LIPOSUCTION ASSISTED;  Surgeon: Cristine Polio, MD;  Location: [facility];  Service: Plastics;  Laterality: Left;    Family History  Problem Relation Age of Onset  . Cancer Sister   . Diabetes Sister   . Mental illness Brother   . Alzheimer's disease Maternal Aunt   . Alzheimer's disease Maternal Grandmother    . Mental illness Sister     Social history:  reports that she has been smoking Cigarettes.  She has a 22 pack-year smoking history. She has never used smokeless tobacco. She reports that she does not drink alcohol or use illicit drugs.   No Known Allergies  Medications:  Current Outpatient Prescriptions on File Prior to Visit  Medication Sig Dispense Refill  . albuterol (PROVENTIL HFA;VENTOLIN HFA) 108 (90 BASE) MCG/ACT inhaler Inhale 2 puffs into the lungs every 6 (six) hours as needed for wheezing.      . phentermine 37.5 MG capsule Take 37.5 mg by mouth every morning.       No current facility-administered medications on file prior to visit.    ROS:  Out of a complete 14 system review of symptoms, the patient complains only of the following symptoms, and all other reviewed systems are negative.  Double vision Cough, wheezing, shortness of breath Leg swelling Excessive thirst Joint pain, joint swelling, walking difficulties, coordination problems Snoring Bruising easily Memory loss, headache, tremors  Blood pressure 124/82, pulse 86, weight 235 lb (106.595 kg).  Physical Exam  General: The patient is alert and cooperative at the time of the examination. The patient is moderately obese.  Skin: No significant peripheral edema is noted.   Neurologic Exam  Mental status: The  Mini-Mental status examination done today shows a total score of 26/30.  Cranial nerves: Facial symmetry is not present. A mild depression of the left nasolabial fold is noted. Speech is normal, no aphasia or dysarthria is noted. Extraocular movements are full. Visual fields are full.  Motor: The patient has good strength in all 4 extremities.  Sensory examination: Soft touch sensation is symmetric on the face, arms, and legs.  Coordination: The patient has good finger-nose-finger and heel-to-shin bilaterally.  Gait and station: The patient has a slightly wide-based gait, the patient uses a cane  for ambulation.. Tandem gait is unsteady. With standing, the patient appears to have tremors involving the thighs bilaterally. Romberg is negative. No drift is seen.  Reflexes: Deep tendon reflexes are symmetric.   Assessment/Plan:  1. Right leg discomfort  2. 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 This may represent an orthostatic tremor. The patient has pain in the right leg with inactivity, this could be a form of restless leg syndrome. The patient will be set up for nerve conduction studies of both legs, EMG of the right leg. The patient is to go on the gabapentin. This dose may be adjusted depending upon the effectiveness of the medication. The patient will followup with EMG evaluation.  Jill Alexanders MD 05/25/2013 7:25 PM  [facility] [address] [place], [place] [address]  Phone [phone] Fax [phone]

## 2013-06-01 ENCOUNTER — Encounter: Payer: Medicaid Other | Admitting: Neurology

## 2013-06-10 ENCOUNTER — Encounter: Payer: Self-pay | Admitting: Neurology

## 2013-06-10 ENCOUNTER — Encounter: Payer: Self-pay | Admitting: Radiology

## 2013-07-30 ENCOUNTER — Telehealth: Payer: Self-pay | Admitting: Neurology

## 2013-07-30 ENCOUNTER — Encounter: Payer: Self-pay | Admitting: Neurology

## 2013-07-30 NOTE — Telephone Encounter (Signed)
This patient did not show for an EMG appointment today.

## 2013-08-11 ENCOUNTER — Ambulatory Visit (INDEPENDENT_AMBULATORY_CARE_PROVIDER_SITE_OTHER): Payer: Medicaid Other | Admitting: Neurology

## 2013-08-11 ENCOUNTER — Encounter (INDEPENDENT_AMBULATORY_CARE_PROVIDER_SITE_OTHER): Payer: Self-pay

## 2013-08-11 DIAGNOSIS — G252 Other specified forms of tremor: Principal | ICD-10-CM

## 2013-08-11 DIAGNOSIS — Z0289 Encounter for other administrative examinations: Secondary | ICD-10-CM

## 2013-08-11 DIAGNOSIS — M545 Low back pain, unspecified: Secondary | ICD-10-CM

## 2013-08-11 DIAGNOSIS — M79609 Pain in unspecified limb: Secondary | ICD-10-CM

## 2013-08-11 DIAGNOSIS — R413 Other amnesia: Secondary | ICD-10-CM

## 2013-08-11 DIAGNOSIS — G250 Essential tremor: Secondary | ICD-10-CM

## 2013-08-11 NOTE — Progress Notes (Signed)
Jasmine Estrada is a 58 year old patient with a history of tremor and low back pain and right leg discomfort. The patient has pain from the back although down to the foot. The pain has worsened over time. She is being evaluated for this issue.  Nerve conduction studies done on both legs were normal. EMG evaluation of the right leg is normal.   The patient continues to have tremors affecting the right foot, and with the right leg. The patient has some variability of the tremor, and the tremor may alternate into the left leg at times.  Given the low back pain, we will check MRI evaluation of the low back. The patient could potentially have a factitious tremor affecting the legs. The patient also gives a history of tremors affecting all 4 extremities at times.  The patient will followup in about 4 months.

## 2013-08-11 NOTE — Procedures (Signed)
     HISTORY:  Jasmine Estrada is a 58 year old patient with a 14 or 15 year history of tremor affecting the legs. The patient has had some low back pain and pain down the right leg. The patient is being evaluated for this discomfort.  NERVE CONDUCTION STUDIES:  Nerve conduction studies were performed on both lower extremities. The distal motor latencies and motor amplitudes for the peroneal and posterior tibial nerves were within normal limits. The nerve conduction velocities for these nerves were also normal. The H reflex latencies were normal. The sensory latencies for the peroneal nerves were within normal limits.   EMG STUDIES:  EMG study was performed on the right lower extremity:  The tibialis anterior muscle reveals 2 to 4K motor units with full recruitment. No fibrillations or positive waves were seen. The peroneus tertius muscle reveals 2 to 4K motor units with full recruitment. No fibrillations or positive waves were seen. The medial gastrocnemius muscle reveals 1 to 3K motor units with full recruitment. No fibrillations or positive waves were seen. The vastus lateralis muscle reveals 2 to 4K motor units with full recruitment. No fibrillations or positive waves were seen. The iliopsoas muscle reveals 2 to 4K motor units with full recruitment. No fibrillations or positive waves were seen. The biceps femoris muscle (long head) reveals 2 to 4K motor units with full recruitment. No fibrillations or positive waves were seen. The lumbosacral paraspinal muscles were tested at 3 levels, and revealed no abnormalities of insertional activity at all 3 levels tested. There was good relaxation.   IMPRESSION:  Nerve conduction studies done on both lower extremities were within normal limits. No evidence of a peripheral neuropathy is seen. EMG evaluation of the right lower extremity was unremarkable, without evidence of an overlying lumbosacral radiculopathy.  Jill Alexanders MD 08/11/2013 1:44  PM  [facility] [address] [place], [place] [address]  Phone [phone] Fax [phone]

## 2013-08-12 ENCOUNTER — Telehealth: Payer: Self-pay | Admitting: Neurology

## 2013-08-12 DIAGNOSIS — M545 Low back pain, unspecified: Secondary | ICD-10-CM | POA: Insufficient documentation

## 2013-08-12 MED ORDER — DULOXETINE HCL 30 MG PO CPEP: ORAL_CAPSULE | ORAL | Status: DC

## 2013-08-12 NOTE — Telephone Encounter (Signed)
Patient was seen yesterday--today has swollen hands, feet and legs--would like Rx called in for fluid pills--[facility] [place]

## 2013-08-12 NOTE — Telephone Encounter (Signed)
I called patient. The patient indicates that she wants something for pain. I will call and Cymbalta. She indicates that she's had swelling in the legs and arms and hands over the last week. I have indicated that she may need to contact her primary care physician regarding this issue. The patient indicates that she is off of the gabapentin currently.

## 2013-08-20 ENCOUNTER — Encounter: Payer: Self-pay | Admitting: Neurology

## 2013-08-20 ENCOUNTER — Telehealth: Payer: Self-pay | Admitting: *Deleted

## 2013-08-20 ENCOUNTER — Telehealth: Payer: Self-pay | Admitting: Neurology

## 2013-08-20 NOTE — Telephone Encounter (Signed)
I called patient. The patient indicates that when the dog Blazer cross her legs, it helps the discomfort in her legs and feet. I'll indicate this in a letter. I'm not sure that this would count as a true service dog.

## 2013-08-20 NOTE — Telephone Encounter (Signed)
Noted. I have already written a letter for this patient.

## 2013-08-20 NOTE — Telephone Encounter (Signed)
Patient calling and stated her grandson gave her a dog recently. When she has tremors in her legs or experience pain, the dog helps her by laying across her feet. She's needing a note from Dr Willis stating the dog's a service dog, in order to have where she live. Please call and advise.  

## 2013-08-20 NOTE — Telephone Encounter (Signed)
States she just got off the phone with Dr. Jannifer Franklin. Wants him to know that the dog can sense when she is about to have a tremor attack and jumps on the bed and lays on her legs.

## 2013-08-20 NOTE — Telephone Encounter (Signed)
States she just got off the phone with Dr. Jannifer Franklin. Wants him to know that the dog can sense when she is about to have a trimmer attach and jumps on the bed and lays on her legs.

## 2013-08-20 NOTE — Telephone Encounter (Signed)
Patient calling and stated her grandson gave her a dog recently. When she has tremors in her legs or experience pain, the dog helps her by laying across her feet. She's needing a note from Dr Jannifer Franklin stating the dog's a service dog, in order to have where she live. Please call and advise.

## 2013-08-28 ENCOUNTER — Other Ambulatory Visit: Payer: Medicaid Other

## 2013-08-29 ENCOUNTER — Ambulatory Visit
Admission: RE | Admit: 2013-08-29 | Discharge: 2013-08-29 | Disposition: A | Discharge status: Home / Self Care | Payer: Medicaid Other | Source: Ambulatory Visit | Attending: Neurology | Admitting: Neurology

## 2013-08-29 DIAGNOSIS — M545 Low back pain, unspecified: Secondary | ICD-10-CM

## 2013-08-29 DIAGNOSIS — R413 Other amnesia: Secondary | ICD-10-CM

## 2013-08-29 DIAGNOSIS — G250 Essential tremor: Secondary | ICD-10-CM

## 2013-08-29 DIAGNOSIS — G252 Other specified forms of tremor: Principal | ICD-10-CM

## 2013-08-29 DIAGNOSIS — M79609 Pain in unspecified limb: Secondary | ICD-10-CM

## 2013-08-29 MED ORDER — GADOBENATE DIMEGLUMINE 529 MG/ML IV SOLN
20.0000 mL | Freq: Once | INTRAVENOUS | Status: AC | PRN
  Administered 2013-08-29: 20 mL via INTRAVENOUS

## 2013-09-01 ENCOUNTER — Telehealth: Payer: Self-pay | Admitting: Neurology

## 2013-09-01 NOTE — Telephone Encounter (Signed)
I called patient. MRI of the low back is unremarkable with exception of some mild degenerative changes, no indication for surgery. The MRI the brain is normal, this would be consistent with the concerns for a factitious tremor involving the legs.   MRI brain 08/31/2013:  Impression   Normal MRI scan of brain with and without contrast.   MRI lumbosacral spine Aug 31 2013:   IMPRESSION: Slightly abnormal MRI scan of the lumbar spine showing mild disc and facet degenerative changes but without significant compression.

## 2013-09-29 ENCOUNTER — Other Ambulatory Visit: Payer: Self-pay | Admitting: Internal Medicine

## 2013-09-29 DIAGNOSIS — Z1231 Encounter for screening mammogram for malignant neoplasm of breast: Secondary | ICD-10-CM

## 2013-10-12 ENCOUNTER — Ambulatory Visit
Admission: RE | Admit: 2013-10-12 | Discharge: 2013-10-12 | Disposition: A | Discharge status: Home / Self Care | Payer: Medicaid Other | Source: Ambulatory Visit | Attending: Internal Medicine | Admitting: Internal Medicine

## 2013-10-12 ENCOUNTER — Encounter (INDEPENDENT_AMBULATORY_CARE_PROVIDER_SITE_OTHER): Payer: Self-pay

## 2013-10-12 DIAGNOSIS — Z1231 Encounter for screening mammogram for malignant neoplasm of breast: Secondary | ICD-10-CM

## 2014-02-26 ENCOUNTER — Other Ambulatory Visit: Payer: Self-pay | Admitting: Internal Medicine

## 2014-03-01 ENCOUNTER — Other Ambulatory Visit: Payer: Self-pay | Admitting: Internal Medicine

## 2014-03-01 DIAGNOSIS — N951 Menopausal and female climacteric states: Secondary | ICD-10-CM

## 2014-03-18 ENCOUNTER — Ambulatory Visit
Admission: RE | Admit: 2014-03-18 | Discharge: 2014-03-18 | Disposition: A | Discharge status: Home / Self Care | Payer: Medicaid Other | Source: Ambulatory Visit | Attending: Internal Medicine | Admitting: Internal Medicine

## 2014-03-18 DIAGNOSIS — N951 Menopausal and female climacteric states: Secondary | ICD-10-CM

## 2014-04-14 NOTE — Telephone Encounter (Signed)
error 

## 2014-05-25 DIAGNOSIS — G5790 Unspecified mononeuropathy of unspecified lower limb: Secondary | ICD-10-CM | POA: Diagnosis not present

## 2014-05-25 DIAGNOSIS — J4520 Mild intermittent asthma, uncomplicated: Secondary | ICD-10-CM | POA: Diagnosis not present

## 2014-06-22 DIAGNOSIS — L84 Corns and callosities: Secondary | ICD-10-CM | POA: Diagnosis not present

## 2014-06-22 DIAGNOSIS — Z131 Encounter for screening for diabetes mellitus: Secondary | ICD-10-CM | POA: Diagnosis not present

## 2014-06-22 DIAGNOSIS — J4520 Mild intermittent asthma, uncomplicated: Secondary | ICD-10-CM | POA: Diagnosis not present

## 2014-09-14 DIAGNOSIS — N3000 Acute cystitis without hematuria: Secondary | ICD-10-CM | POA: Diagnosis not present

## 2014-09-23 DIAGNOSIS — N3281 Overactive bladder: Secondary | ICD-10-CM | POA: Diagnosis not present

## 2014-09-23 DIAGNOSIS — R3129 Other microscopic hematuria: Secondary | ICD-10-CM | POA: Insufficient documentation

## 2014-11-11 DIAGNOSIS — N3946 Mixed incontinence: Secondary | ICD-10-CM | POA: Insufficient documentation

## 2014-11-11 DIAGNOSIS — N3941 Urge incontinence: Secondary | ICD-10-CM | POA: Insufficient documentation

## 2014-12-07 DIAGNOSIS — Z131 Encounter for screening for diabetes mellitus: Secondary | ICD-10-CM | POA: Diagnosis not present

## 2014-12-07 DIAGNOSIS — F17210 Nicotine dependence, cigarettes, uncomplicated: Secondary | ICD-10-CM | POA: Diagnosis not present

## 2014-12-07 DIAGNOSIS — E784 Other hyperlipidemia: Secondary | ICD-10-CM | POA: Diagnosis not present

## 2014-12-07 DIAGNOSIS — J4520 Mild intermittent asthma, uncomplicated: Secondary | ICD-10-CM | POA: Diagnosis not present

## 2014-12-07 DIAGNOSIS — E559 Vitamin D deficiency, unspecified: Secondary | ICD-10-CM | POA: Diagnosis not present

## 2014-12-07 DIAGNOSIS — G5790 Unspecified mononeuropathy of unspecified lower limb: Secondary | ICD-10-CM | POA: Diagnosis not present

## 2014-12-07 DIAGNOSIS — Z79899 Other long term (current) drug therapy: Secondary | ICD-10-CM | POA: Diagnosis not present

## 2014-12-09 ENCOUNTER — Other Ambulatory Visit: Payer: Self-pay

## 2014-12-09 DIAGNOSIS — Z1231 Encounter for screening mammogram for malignant neoplasm of breast: Secondary | ICD-10-CM

## 2014-12-10 ENCOUNTER — Ambulatory Visit: Payer: Medicaid Other | Admitting: Podiatry

## 2014-12-27 ENCOUNTER — Ambulatory Visit: Payer: Medicaid Other | Admitting: Podiatry

## 2015-01-03 ENCOUNTER — Ambulatory Visit (INDEPENDENT_AMBULATORY_CARE_PROVIDER_SITE_OTHER): Payer: Medicare Other | Admitting: Podiatry

## 2015-01-03 ENCOUNTER — Encounter: Payer: Self-pay | Admitting: Podiatry

## 2015-01-03 VITALS — BP 128/78 | HR 75

## 2015-01-03 DIAGNOSIS — M79606 Pain in leg, unspecified: Secondary | ICD-10-CM

## 2015-01-03 DIAGNOSIS — M216X9 Other acquired deformities of unspecified foot: Secondary | ICD-10-CM

## 2015-01-03 DIAGNOSIS — M79604 Pain in right leg: Secondary | ICD-10-CM

## 2015-01-03 DIAGNOSIS — B351 Tinea unguium: Secondary | ICD-10-CM

## 2015-01-03 DIAGNOSIS — M216X1 Other acquired deformities of right foot: Secondary | ICD-10-CM

## 2015-01-03 DIAGNOSIS — M659 Synovitis and tenosynovitis, unspecified: Secondary | ICD-10-CM

## 2015-01-03 DIAGNOSIS — M6789 Other specified disorders of synovium and tendon, multiple sites: Secondary | ICD-10-CM | POA: Diagnosis not present

## 2015-01-03 DIAGNOSIS — M79673 Pain in unspecified foot: Secondary | ICD-10-CM

## 2015-01-03 DIAGNOSIS — M6588 Other synovitis and tenosynovitis, other site: Secondary | ICD-10-CM | POA: Diagnosis not present

## 2015-01-03 DIAGNOSIS — M79605 Pain in left leg: Secondary | ICD-10-CM

## 2015-01-03 DIAGNOSIS — M216X2 Other acquired deformities of left foot: Secondary | ICD-10-CM

## 2015-01-03 DIAGNOSIS — M65979 Unspecified synovitis and tenosynovitis, unspecified ankle and foot: Secondary | ICD-10-CM

## 2015-01-03 DIAGNOSIS — M76829 Posterior tibial tendinitis, unspecified leg: Secondary | ICD-10-CM

## 2015-01-03 NOTE — Progress Notes (Signed)
SUBJECTIVE: 59 y.o. year old female presents complaining of feet hurt every day x since 1990's. She was told that she has a neuropathy.  Can not walk like used to because of painful calluses.  History of having a stroke in 2014 and right side got weak. 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 Not working since 2014. Been to a foot doctor in [facility] twice this year.   REVIEW OF SYSTEMS: A comprehensive review of systems was negative except for: Stroke in 2014 that left her weakness and twitching o foot right side, history of tumor removal left knee 2015.   OBJECTIVE: DERMATOLOGIC EXAMINATION: Nails: Thick dystrophic nails x 10. VASCULAR EXAMINATION OF LOWER LIMBS: Pedal pulses: All pedal pulses are palpable with normal pulsation.  No edema or erythema noted. Temperature gradient from tibial crest to dorsum of foot is within normal bilateral. NEUROLOGIC EXAMINATION OF THE LOWER LIMBS: Achilles DTR is present and within normal. Monofilament (Semmes-Weinstein 10-gm) sensory testing positive 6 out of 6, bilateral. Vibratory sensations(128Hz  turning fork) intact at medial and lateral forefoot bilateral.  Sharp and Dull discriminatory sensations at the plantar ball of hallux is intact bilateral.  MUSCULOSKELETAL EXAMINATION: Positive for severe hallux valgus with bunion bilateral R>L.  Flattened arches bilateral. Abducted forefoot bilateral. Excess sagittal plane motion of the first metatarsals bilateral. Tight Achilles tendons bilateral.  Excess STJ pronation bilateral with load bearing.   RADIOGRAPHIC FINDINGS: Positive of generalized osteopenia bilateral. Severe Hallux valgus bilateral. Severe increase in the first intermetatarsal angles bilateral.  Collapsed mid arch L>R. Severe anterior break of CYMA line L>R. Elevated first ray bilateral.  Increased lateral deviation angle of the Calcaneocuboid lateral deviation angles bilateral.   ASSESSMENT: Painful mycotic  nails x 10. Painful plantar callus left heel and right arch. Symptomatic pes planus bilateral, more symptomatic right foot. Posterior tibialis dysfunction bilateral. Ankle equinus bilateral. Tenosynovitis lateral ankles bilateral. Severe STJ pronation bilateral.   PLAN: Reviewed clinical findings and available treatment options.  All nails and calluses debrided. Ankle brace dispensed with instruction on right foot. Reviewed stretch exercise for tight Achilles tendon bilateral.

## 2015-01-03 NOTE — Patient Instructions (Signed)
Seen for painful feet and ankles. Noted of abnormal over grown thick toe nails x 10. Thick painful callus under left heel and center right foot plantar.  Noted of collapsed arches and subluxed joints in X-ray bilateral with tight Achilles tendon bilateral.  All nails and calluses debrided. Dispensed Lace up Ankle brace for right ankle to put on during the day at the start of day and take off in the evening each day.  Return in one month to re-evaluate the painful feet and ankle condition.

## 2015-01-27 ENCOUNTER — Ambulatory Visit: Payer: Medicaid Other

## 2015-02-01 DIAGNOSIS — J069 Acute upper respiratory infection, unspecified: Secondary | ICD-10-CM | POA: Diagnosis not present

## 2015-02-02 ENCOUNTER — Encounter: Payer: Self-pay | Admitting: Podiatry

## 2015-02-02 ENCOUNTER — Ambulatory Visit (INDEPENDENT_AMBULATORY_CARE_PROVIDER_SITE_OTHER): Payer: Medicare Other | Admitting: Podiatry

## 2015-02-02 VITALS — BP 135/78 | HR 87

## 2015-02-02 DIAGNOSIS — M6588 Other synovitis and tenosynovitis, other site: Secondary | ICD-10-CM | POA: Diagnosis not present

## 2015-02-02 DIAGNOSIS — M6789 Other specified disorders of synovium and tendon, multiple sites: Secondary | ICD-10-CM

## 2015-02-02 DIAGNOSIS — M216X9 Other acquired deformities of unspecified foot: Secondary | ICD-10-CM | POA: Insufficient documentation

## 2015-02-02 DIAGNOSIS — M65979 Unspecified synovitis and tenosynovitis, unspecified ankle and foot: Secondary | ICD-10-CM

## 2015-02-02 DIAGNOSIS — M659 Synovitis and tenosynovitis, unspecified: Secondary | ICD-10-CM

## 2015-02-02 DIAGNOSIS — M216X2 Other acquired deformities of left foot: Secondary | ICD-10-CM

## 2015-02-02 DIAGNOSIS — M216X1 Other acquired deformities of right foot: Secondary | ICD-10-CM

## 2015-02-02 DIAGNOSIS — M76829 Posterior tibial tendinitis, unspecified leg: Secondary | ICD-10-CM

## 2015-02-02 NOTE — Patient Instructions (Signed)
Seen for pain in right foot. Discusses tendon lengthening on right foot.

## 2015-02-02 NOTE — Progress Notes (Signed)
SUBJECTIVE: 59 y.o. year old female presents complaining of pain in right foot and ankle since Friday.  Ankle brace is working out good. Still does stretch exercise and rides exercise bike.  No problem with left side. Been taking Codeine with Tylenol.   Past Podiatric history reveals that her feet has been hurting every day x since 1990's.  She was told that she has a neuropathy.  Can not walk like used to because of painful calluses.  History of having a stroke in 2014 and right side got weak. 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 Not working since 2014.  Been to [facility] twice this year.    OBJECTIVE: DERMATOLOGIC EXAMINATION: Nails: Thick dystrophic nails x 10. VASCULAR EXAMINATION OF LOWER LIMBS: Pedal pulses: All pedal pulses are palpable with normal pulsation.  No edema or erythema noted. Temperature gradient from tibial crest to dorsum of foot is within normal bilateral. NEUROLOGIC EXAMINATION OF THE LOWER LIMBS: Achilles DTR is present and within normal. Monofilament (Semmes-Weinstein 10-gm) sensory testing positive 6 out of 6, bilateral. Vibratory sensations(128Hz  turning fork) intact at medial and lateral forefoot bilateral.  Sharp and Dull discriminatory sensations at the plantar ball of hallux is intact bilateral.  MUSCULOSKELETAL EXAMINATION: Positive for severe hallux valgus with bunion bilateral R>L.  Flattened arches bilateral. Abducted forefoot bilateral. Excess sagittal plane motion of the first metatarsals bilateral. Tight Achilles tendons bilateral.  Excess STJ pronation bilateral with load bearing.   RADIOGRAPHIC FINDINGS: Positive of generalized osteopenia bilateral. Severe Hallux valgus bilateral. Severe increase in the first intermetatarsal angles bilateral.  Collapsed mid arch L>R. Severe anterior break of CYMA line L>R. Elevated first ray bilateral.  Increased lateral deviation angle of the Calcaneocuboid lateral deviation  angles bilateral.   ASSESSMENT: Symptomatic pes planus bilateral, more symptomatic right foot. Posterior tibialis dysfunction bilateral. Ankle equinus bilateral. Tenosynovitis lateral ankles bilateral. Severe STJ pronation bilateral.   PLAN: Reviewed clinical findings and available treatment options.  Continue with Ankle brace. Reviewed benefit of Tendo-Achilles lengthening procedure to reduce ankle pain on right. Patient agrees to have this procedure done. Pre-op consent form reviewed for Per cutaneous Tendo Achilles lengthening.

## 2015-02-10 ENCOUNTER — Ambulatory Visit
Admission: RE | Admit: 2015-02-10 | Discharge: 2015-02-10 | Disposition: A | Discharge status: Home / Self Care | Payer: Medicare Other | Source: Ambulatory Visit

## 2015-02-10 DIAGNOSIS — Z1231 Encounter for screening mammogram for malignant neoplasm of breast: Secondary | ICD-10-CM

## 2015-02-24 ENCOUNTER — Other Ambulatory Visit: Payer: Self-pay | Admitting: Podiatry

## 2015-02-24 MED ORDER — HYDROCODONE-IBUPROFEN 7.5-200 MG PO TABS: 1.0000 | ORAL_TABLET | Freq: Four times a day (QID) | ORAL | Status: DC | PRN

## 2015-02-25 DIAGNOSIS — M6700 Short Achilles tendon (acquired), unspecified ankle: Secondary | ICD-10-CM | POA: Diagnosis not present

## 2015-02-25 HISTORY — PX: ACHILLES TENDON LENGTHENING: SHX6455

## 2015-02-28 ENCOUNTER — Encounter: Payer: Self-pay | Admitting: *Deleted

## 2015-03-02 ENCOUNTER — Encounter: Payer: Self-pay | Admitting: Podiatry

## 2015-03-02 ENCOUNTER — Ambulatory Visit (INDEPENDENT_AMBULATORY_CARE_PROVIDER_SITE_OTHER): Payer: Medicare Other | Admitting: Podiatry

## 2015-03-02 DIAGNOSIS — Z9889 Other specified postprocedural states: Secondary | ICD-10-CM

## 2015-03-02 NOTE — Progress Notes (Signed)
Post op TAL right (02/25/15). Doing well walking in cast. Denies any pain or discomfort. All digits show normal color in cast. No sign of edema noted. Satisfactory progress, consistent with the surgery performed. Return in 4 weeks from the surgery.

## 2015-03-02 NOTE — Patient Instructions (Signed)
Post op TAL right doing well in cast walking with walker. Continue light ambulation. Return in 4 weeks from the surgery.

## 2015-03-22 ENCOUNTER — Encounter: Payer: Self-pay | Admitting: Podiatry

## 2015-03-22 ENCOUNTER — Ambulatory Visit (INDEPENDENT_AMBULATORY_CARE_PROVIDER_SITE_OTHER): Payer: Medicare Other | Admitting: Podiatry

## 2015-03-22 ENCOUNTER — Telehealth: Payer: Self-pay | Admitting: *Deleted

## 2015-03-22 DIAGNOSIS — M216X1 Other acquired deformities of right foot: Secondary | ICD-10-CM

## 2015-03-22 DIAGNOSIS — Z9889 Other specified postprocedural states: Secondary | ICD-10-CM

## 2015-03-22 MED ORDER — HYDROCODONE-IBUPROFEN 7.5-200 MG PO TABS: 1.0000 | ORAL_TABLET | Freq: Four times a day (QID) | ORAL | Status: DC | PRN

## 2015-03-22 NOTE — Telephone Encounter (Signed)
03/22/2015 Pt called and stated she dropped her pills down the sink and can she have another RX for Hydrocodone.

## 2015-03-22 NOTE — Progress Notes (Signed)
4 week post op. Cast intact. Denies any discomfort with cast. Walking with walker.  Upon removal of cast, noted of good wound healing at surgical site. Good dorsiflexion of ankle joint right foot. Normal post op recovery. Placed in CAM walker with instruction. Return in 3 weeks.

## 2015-03-22 NOTE — Patient Instructions (Signed)
4 week post op. Cast removed. Sutures removed. Normal wound healing. Placed in CAM walker. Return in 3 weeks.

## 2015-03-24 ENCOUNTER — Encounter: Payer: Medicare Other | Admitting: Podiatry

## 2015-04-12 ENCOUNTER — Encounter: Payer: Self-pay | Admitting: Podiatry

## 2015-04-12 ENCOUNTER — Ambulatory Visit (INDEPENDENT_AMBULATORY_CARE_PROVIDER_SITE_OTHER): Payer: Medicare Other | Admitting: Podiatry

## 2015-04-12 VITALS — BP 135/81 | HR 83

## 2015-04-12 DIAGNOSIS — M25473 Effusion, unspecified ankle: Secondary | ICD-10-CM | POA: Insufficient documentation

## 2015-04-12 DIAGNOSIS — M659 Synovitis and tenosynovitis, unspecified: Secondary | ICD-10-CM

## 2015-04-12 DIAGNOSIS — M6588 Other synovitis and tenosynovitis, other site: Secondary | ICD-10-CM

## 2015-04-12 DIAGNOSIS — R609 Edema, unspecified: Secondary | ICD-10-CM | POA: Diagnosis not present

## 2015-04-12 NOTE — Patient Instructions (Signed)
Post op following Tendoachilles lengthening. Feet are doing better except for ankle edema. Ankle brace dispensed. Return as needed.

## 2015-04-12 NOTE — Progress Notes (Signed)
Subjective: 6 weeks post op TAL right (02/25/15). Doing well walking in cast. Denies any pain or discomfort. Doing a lot better. Pain, around the ankle is not as bad as before the sugery.  Still having swelling problem.    Objective: Positive mild edema noted in right ankle. Satisfactory progress, consistent with the surgery performed. Hypertrophic and mycotic nails x 10.   Assessment: Improved foot condition following surgery. Still having excess pronation with ankle edema.  Plan: Right ankle placed in ankle brace. All nails debrided.

## 2015-05-25 DIAGNOSIS — F070 Personality change due to known physiological condition: Secondary | ICD-10-CM | POA: Diagnosis not present

## 2015-05-25 DIAGNOSIS — E538 Deficiency of other specified B group vitamins: Secondary | ICD-10-CM | POA: Diagnosis not present

## 2015-05-25 DIAGNOSIS — E559 Vitamin D deficiency, unspecified: Secondary | ICD-10-CM | POA: Diagnosis not present

## 2015-07-12 ENCOUNTER — Ambulatory Visit: Payer: Medicare Other | Admitting: Podiatry

## 2015-07-20 ENCOUNTER — Ambulatory Visit (INDEPENDENT_AMBULATORY_CARE_PROVIDER_SITE_OTHER): Payer: Medicare Other | Admitting: Podiatry

## 2015-07-20 ENCOUNTER — Encounter: Payer: Self-pay | Admitting: Podiatry

## 2015-07-20 VITALS — BP 137/84 | HR 87

## 2015-07-20 DIAGNOSIS — M79605 Pain in left leg: Secondary | ICD-10-CM

## 2015-07-20 DIAGNOSIS — B351 Tinea unguium: Secondary | ICD-10-CM | POA: Diagnosis not present

## 2015-07-20 NOTE — Progress Notes (Signed)
Subjective: 6 months post op TAL right (02/25/15).  Has done well recovering from the surgery. Now the ankle is swelling and hurting.  Thick and long nails are hurting to walk.   Objective: Bilateral ankle edema. Collapsing Medial longitudinal arch. Pronating STJ bilateral. Hypertrophic and mycotic nails x 10.   Assessment: Improved foot condition following TAL surgery. Still having excess pronation with ankle edema bilateral. Onychomycosis, symptomatic.   Plan: Continue with ankle brace on right foot. All nails debrided. Return as need.

## 2015-07-20 NOTE — Patient Instructions (Signed)
Seen for hypertrophic nails. Still having pain in right ankle. Need to keep ankle brace for the weak and arthritic right foot. All nails debrided. Return in 3 months or as needed.

## 2015-07-21 ENCOUNTER — Ambulatory Visit: Payer: Medicare Other | Admitting: Podiatry

## 2015-10-19 ENCOUNTER — Encounter: Payer: Self-pay | Admitting: Podiatry

## 2015-10-19 ENCOUNTER — Ambulatory Visit (INDEPENDENT_AMBULATORY_CARE_PROVIDER_SITE_OTHER): Payer: Medicare Other | Admitting: Podiatry

## 2015-10-19 VITALS — BP 128/81 | HR 90

## 2015-10-19 DIAGNOSIS — M79673 Pain in unspecified foot: Secondary | ICD-10-CM | POA: Diagnosis not present

## 2015-10-19 DIAGNOSIS — B351 Tinea unguium: Secondary | ICD-10-CM

## 2015-10-19 DIAGNOSIS — M216X2 Other acquired deformities of left foot: Secondary | ICD-10-CM

## 2015-10-19 DIAGNOSIS — M79604 Pain in right leg: Secondary | ICD-10-CM

## 2015-10-19 DIAGNOSIS — M659 Synovitis and tenosynovitis, unspecified: Secondary | ICD-10-CM

## 2015-10-19 DIAGNOSIS — M216X9 Other acquired deformities of unspecified foot: Secondary | ICD-10-CM

## 2015-10-19 DIAGNOSIS — M65979 Unspecified synovitis and tenosynovitis, unspecified ankle and foot: Secondary | ICD-10-CM

## 2015-10-19 DIAGNOSIS — M216X1 Other acquired deformities of right foot: Secondary | ICD-10-CM

## 2015-10-19 NOTE — Patient Instructions (Signed)
Seen for hypertrophic nails. Still having pain in right foot from toe to thigh. All nails debrided. Noted of unstable first metatarsal bone and lateral weight shifting. Reviewed possible surgical option, fuse the first Metatarso-cuneiform joint to stabilize the first ray right foot. This surgery wound require 2-3 month post op care including 6-8 weeks in cast. Return in 3 months for routine foot care or as needed.

## 2015-10-19 NOTE — Progress Notes (Signed)
Subjective: Came in using a cane.  Pain runs up and down from toes to up thigh all day long and feels hot for duration of 2 months. Does exercise bike and uses Elliptical. Cannot do long period due to leg pain.  Has done well recovering from the surgery. Now the ankle is swelling and hurting.  Thick and long nails are hurting to walk.  History: TAL right (02/25/15).   Objective: Bilateral ankle edema. Collapsing Medial longitudinal arch. Pronating STJ bilateral. Hypertrophic and mycotic nails x 10.   Assessment: Improved foot condition following TAL surgery. Still having excess pronation with ankle edema bilateral. Hypermobile first ray right. Onychomycosis, symptomatic.   Plan: Continue with ankle brace on right foot. All nails debrided. May benefit from Lapidus fusion on right. Return as need.

## 2015-12-20 ENCOUNTER — Encounter: Payer: Self-pay | Admitting: Podiatry

## 2015-12-20 ENCOUNTER — Ambulatory Visit (INDEPENDENT_AMBULATORY_CARE_PROVIDER_SITE_OTHER): Payer: Medicare Other | Admitting: Podiatry

## 2015-12-20 VITALS — BP 150/85 | HR 86

## 2015-12-20 DIAGNOSIS — M6588 Other synovitis and tenosynovitis, other site: Secondary | ICD-10-CM | POA: Diagnosis not present

## 2015-12-20 DIAGNOSIS — M6789 Other specified disorders of synovium and tendon, multiple sites: Secondary | ICD-10-CM | POA: Diagnosis not present

## 2015-12-20 DIAGNOSIS — M79673 Pain in unspecified foot: Secondary | ICD-10-CM | POA: Diagnosis not present

## 2015-12-20 DIAGNOSIS — M76829 Posterior tibial tendinitis, unspecified leg: Secondary | ICD-10-CM

## 2015-12-20 DIAGNOSIS — M79604 Pain in right leg: Secondary | ICD-10-CM

## 2015-12-20 DIAGNOSIS — M659 Synovitis and tenosynovitis, unspecified: Secondary | ICD-10-CM

## 2015-12-20 MED ORDER — HYDROCODONE-IBUPROFEN 7.5-200 MG PO TABS: 1.0000 | ORAL_TABLET | Freq: Four times a day (QID) | ORAL | 0 refills | Status: DC | PRN

## 2015-12-20 NOTE — Progress Notes (Signed)
Subjective: Came in using a cane.  Wish to hold off on right foot surgery. Has a plan to visit [place] to visit family.  Still having pain in right foot but getting around using cane.  Still does exercise bike and uses Elliptical. Cannot do long period due to leg pain. Patient request toe nails cut.   Has done well recovering from the Right TAL surgery. Now the ankle is swelling and hurting.  Thick and long nails are hurting to walk.  History: TAL right (02/25/15).   Objective: Bilateral ankle edema. Collapsing Medial longitudinal arch. Pronating STJ bilateral. Hypertrophic and mycotic nails x 10.   Assessment: Improved foot condition following TAL surgery. Still having excess pronation with ankle edema bilateral. Hypermobile first ray right. Onychomycosis, symptomatic.   Plan: Continue with ankle brace on right foot. Return as need.

## 2015-12-20 NOTE — Patient Instructions (Signed)
Doing well with right foot using cane. May take pain medication as needed for the next few weeks.  Return in one month.

## 2015-12-26 ENCOUNTER — Telehealth: Payer: Self-pay | Admitting: *Deleted

## 2015-12-26 MED ORDER — HYDROCODONE-IBUPROFEN 7.5-200 MG PO TABS: 1.0000 | ORAL_TABLET | Freq: Four times a day (QID) | ORAL | 0 refills | Status: DC | PRN

## 2015-12-26 NOTE — Telephone Encounter (Signed)
12/26/15 Dr. Caffie Pinto,  Patient called this am and said she can't find her prescription you gave her last week, She dont know if she left it on the transportation  [place] or not. Can you write her another one please?

## 2016-01-16 ENCOUNTER — Other Ambulatory Visit: Payer: Self-pay | Admitting: Internal Medicine

## 2016-01-16 DIAGNOSIS — Z1231 Encounter for screening mammogram for malignant neoplasm of breast: Secondary | ICD-10-CM

## 2016-01-19 ENCOUNTER — Ambulatory Visit (INDEPENDENT_AMBULATORY_CARE_PROVIDER_SITE_OTHER): Payer: Medicare Other | Admitting: Podiatry

## 2016-01-19 ENCOUNTER — Encounter: Payer: Self-pay | Admitting: Podiatry

## 2016-01-19 DIAGNOSIS — M79672 Pain in left foot: Secondary | ICD-10-CM | POA: Diagnosis not present

## 2016-01-19 DIAGNOSIS — L851 Acquired keratosis [keratoderma] palmaris et plantaris: Secondary | ICD-10-CM

## 2016-01-19 DIAGNOSIS — B351 Tinea unguium: Secondary | ICD-10-CM

## 2016-01-19 DIAGNOSIS — M79671 Pain in right foot: Secondary | ICD-10-CM | POA: Diagnosis not present

## 2016-01-19 NOTE — Progress Notes (Signed)
Subjective: Came in using a cane. Patient request nails and calluses trimmed.  HPI: Has done well since Right TAL surgery (02/25/15). 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 Not able to have surgery at this time. Having intermittent pain and swelling right ankle .  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   Objective: Collapsing Medial longitudinal arch. Pronating STJ bilateral. Hypertrophic and mycotic nails x 10.  Deep circular porokeratotic tissue under center plantar right, center heel left foot, painful to walk.  Assessment: Improved foot condition following TAL surgery. Still having excess pronation with intermittent ankle edema bilateral. Hypermobile first ray right. Onychomycosis, symptomatic.  Porokeratosis plantar bilateral, symptomatic.  Plan: Debrided all nails. Debrided all painful porokeratotic lesions bilateral.  Continue with ankle brace on right foot. Return as need.

## 2016-01-19 NOTE — Patient Instructions (Signed)
Seen for hypertrophic nails and painful calluses. All nails and plantar lesions debrided. Return in 3 months or as needed.

## 2016-02-13 ENCOUNTER — Ambulatory Visit
Admission: RE | Admit: 2016-02-13 | Discharge: 2016-02-13 | Disposition: A | Discharge status: Home / Self Care | Payer: Medicare Other | Source: Ambulatory Visit | Attending: Internal Medicine | Admitting: Internal Medicine

## 2016-02-13 DIAGNOSIS — Z1231 Encounter for screening mammogram for malignant neoplasm of breast: Secondary | ICD-10-CM

## 2016-04-19 ENCOUNTER — Ambulatory Visit: Payer: Medicare Other | Admitting: Podiatry

## 2016-04-23 ENCOUNTER — Encounter: Payer: Medicare Other | Attending: Internal Medicine | Admitting: Registered"

## 2016-04-23 VITALS — Ht 67.0 in | Wt 234.7 lb

## 2016-04-23 DIAGNOSIS — E6609 Other obesity due to excess calories: Secondary | ICD-10-CM

## 2016-04-23 DIAGNOSIS — Z713 Dietary counseling and surveillance: Secondary | ICD-10-CM | POA: Insufficient documentation

## 2016-04-23 DIAGNOSIS — Z6836 Body mass index (BMI) 36.0-36.9, adult: Secondary | ICD-10-CM

## 2016-04-23 NOTE — Progress Notes (Signed)
Medical Nutrition Therapy:  Appt start time: 0800 end time:  0900.  Assessment:  Primary concerns today: Weight management Patient is discouraged because she has not been able to walk much since having surgery on right foot (arthritis, gout and tremors), affects balance and uses 4 prong walking cane. Patient reports she wants to keep walking even though it is painful but she needs to have someone walk with her because she is afraid of falling (CNA not willing). She reports she has gained 60 lbs within the last year she believes due to not being able to walk and added stress in her life. Discussed options for physical activity to also help with stress management. Other options for physical activity she can use other exercise equipment she owns. Also encouraged her to be more involved in the senior center activities where she lives.  Usual eating pattern includes 3 meals and 1+ snacks per day.     Breakfast: Coffee, oatmeal with apple and honey OR PB&J sandwich and fruit OR pancakes OR peaches and toast Snack: Peanut butter crackers, apples Lunch: canned chicken noodle soup OR PB&J OR bologna sandwich. Snack: usually not hungry before dinner but may eat due to depression. Dinner: Joylene Draft, wheat bread, potatoes and onions and frozen vegetables OR chicken legs and cream of chicken. Beverages: ginger ale, water, coffee with flavored creamer and extra sugar.   Avoided foods include: white bread, concentrated sweets, has stopped eating fried foods     Usual physical activity includes ADL, limited due to pain in foot after surgery ~6 months ago  Plan: Exercise: 30 min 3 times a week. Look into gym or [employer], getting more involved in the [employer]. As doctor if physical therapy is an option. Meal time: eat slower and be mindful of when you are no longer hungry. Protein shake idea: Smoothie with berries, banana, and greek plain yogurt for protein. Avoid foods with a lot of salt - frozen  biscuits, canned soups, continue rinsing beans (she reports issues with swelling)  Diagnosis and Intervention:  Progress Towards Goal(s):  Some progress. Patient reports making changes to diet in the last couple of weeks (since Christmas) and has cut out sweets and has replaced white bread with whole wheat bread and cut down some portion sizes   Nutritional Diagnosis:  NI-1.5 Excessive energy intake As related to excess consumption of concentrated sweets.  As evidenced by Dietary recall.     Monitoring/Evaluation:  Dietary intake, weight,  prn

## 2016-04-23 NOTE — Patient Instructions (Addendum)
Exercise: 30 min 3 times a week. Look into gym or [employer], getting more involved in the [employer]. As doctor if physical therapy is an option. Meal time: eat slower and be mindful of when you are no longer hungry. Protein shake idea: Smoothie berries, banana greek plain yogurt Avoid foods with a lot of salt - frozen biscuits, canned soups, continue rinsing beans  .

## 2016-05-23 ENCOUNTER — Encounter: Payer: Self-pay | Admitting: Podiatry

## 2016-05-23 ENCOUNTER — Ambulatory Visit (INDEPENDENT_AMBULATORY_CARE_PROVIDER_SITE_OTHER): Payer: Medicare Other | Admitting: Podiatry

## 2016-05-23 VITALS — BP 155/89 | HR 84

## 2016-05-23 DIAGNOSIS — L851 Acquired keratosis [keratoderma] palmaris et plantaris: Secondary | ICD-10-CM | POA: Diagnosis not present

## 2016-05-23 DIAGNOSIS — M79672 Pain in left foot: Secondary | ICD-10-CM

## 2016-05-23 DIAGNOSIS — B351 Tinea unguium: Secondary | ICD-10-CM | POA: Diagnosis not present

## 2016-05-23 DIAGNOSIS — M79671 Pain in right foot: Secondary | ICD-10-CM

## 2016-05-23 NOTE — Patient Instructions (Signed)
Seen for painful nails and porokeratotic lesions. All lesions debrided. May benefit from minor procedure to remove keratotic lesion. Place Acid patch (like Wart patch) at the lesion for 5-7 days prior to the procedure. Return in 2 weeks for office procedure.

## 2016-05-23 NOTE — Progress Notes (Signed)
Subjective: 61 year old female presents requesting nails and calluses trimmed. There are lesions under the bottom of both feet and they are very painful to walk.   HPI: Has done well since Right TAL surgery (02/25/15). 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 Not able to have surgery at this time. Having intermittent pain and swelling right ankle .  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   Objective: Dermatologic: Thick dystrophic nails x 10. Deep circular porokeratotic lesions painful under the center right foot and heel on left foot. Collapsing Medial longitudinal arch. Pronating STJ bilateral.  Assessment: Onychomycosis, symptomatic.  Porokeratosis plantar bilateral, symptomatic.  Plan: Debrided all nails. Debrided all painful porokeratotic lesions bilateral.  Reviewed possible office procedure to remove painful lesion. Explained this has only 50% success rate. Patient may apply acid patches prior to have the lesion removed. Return in 2 weeks to have the lesion removed.

## 2016-06-06 ENCOUNTER — Ambulatory Visit (INDEPENDENT_AMBULATORY_CARE_PROVIDER_SITE_OTHER): Payer: Medicare Other | Admitting: Podiatry

## 2016-06-06 ENCOUNTER — Encounter: Payer: Self-pay | Admitting: Podiatry

## 2016-06-06 DIAGNOSIS — B070 Plantar wart: Secondary | ICD-10-CM | POA: Diagnosis not present

## 2016-06-06 DIAGNOSIS — M79672 Pain in left foot: Secondary | ICD-10-CM

## 2016-06-06 DIAGNOSIS — L851 Acquired keratosis [keratoderma] palmaris et plantaris: Secondary | ICD-10-CM

## 2016-06-06 DIAGNOSIS — M79671 Pain in right foot: Secondary | ICD-10-CM

## 2016-06-06 MED ORDER — HYDROCODONE-IBUPROFEN 7.5-200 MG PO TABS: 1.0000 | ORAL_TABLET | Freq: Four times a day (QID) | ORAL | 0 refills | Status: DC | PRN

## 2016-06-06 NOTE — Patient Instructions (Signed)
Office procedure done under local on right foot lesion.  Keep the bandage clean and dry. May take pain medication as needed. Return in one week.

## 2016-06-06 NOTE — Progress Notes (Signed)
Subjective: 61 year old female presents for a scheduled procedure on right foot lesion.   HPI: Has done well sinceRight TAL surgery (02/25/15). 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 Not able to have surgery at this time. Having intermittent pain and swelling right ankle.  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   Objective: Dermatologic: Thick dystrophic nails x 10. Deep circular porokeratotic lesions painful under the center right foot and heel on left foot. Collapsing Medial longitudinal arch. Pronating STJ bilateral.  Assessment: Onychomycosis, symptomatic.  Porokeratosis plantar bilateral, symptomatic. R/O Inverted Verruca plantar bilateral.  Plan: Consent form reviewed removal of plantar lesion right foot under local.  Procedure performed: Excision plantar lesion right foot 1.1 cm in diameter under local right plantar mid surface.  Local used total 50/50 mixture 0.5% Marcaine plain and 1 % Xylocaine with Epi. Wound prepped with Iodine. Post op dressing applied with Amerigel ointment and Betadine compression. Patient is to keep the dressing dry and return in one week. May take pain medication as needed.

## 2016-06-13 ENCOUNTER — Encounter: Payer: Self-pay | Admitting: Podiatry

## 2016-06-13 ENCOUNTER — Ambulatory Visit (INDEPENDENT_AMBULATORY_CARE_PROVIDER_SITE_OTHER): Payer: Medicare Other | Admitting: Podiatry

## 2016-06-13 DIAGNOSIS — B070 Plantar wart: Secondary | ICD-10-CM

## 2016-06-13 NOTE — Patient Instructions (Signed)
Post op wound healing doing well without complication.  Return in 3 months for routine foot care or sooner if needed.

## 2016-06-13 NOTE — Progress Notes (Signed)
Subjective: 61 year old female presents for a follow up on right foot lesion surgery. No complaints. Wound healing satisfactory. Will wait for the left foot lesion before surgical intervention. Will see how the right foot is doing.  Return for RFC or if painful.

## 2016-06-19 ENCOUNTER — Other Ambulatory Visit: Payer: Self-pay | Admitting: Internal Medicine

## 2016-06-19 DIAGNOSIS — E2839 Other primary ovarian failure: Secondary | ICD-10-CM

## 2016-06-25 ENCOUNTER — Ambulatory Visit
Admission: RE | Admit: 2016-06-25 | Discharge: 2016-06-25 | Disposition: A | Discharge status: Home / Self Care | Payer: Medicare Other | Source: Ambulatory Visit | Attending: Internal Medicine | Admitting: Internal Medicine

## 2016-06-25 DIAGNOSIS — E2839 Other primary ovarian failure: Secondary | ICD-10-CM

## 2016-09-11 ENCOUNTER — Ambulatory Visit: Payer: Medicare Other | Admitting: Podiatry

## 2016-09-26 ENCOUNTER — Encounter: Payer: Self-pay | Admitting: Podiatry

## 2016-09-26 ENCOUNTER — Ambulatory Visit (INDEPENDENT_AMBULATORY_CARE_PROVIDER_SITE_OTHER): Payer: Medicare Other | Admitting: Podiatry

## 2016-09-26 DIAGNOSIS — M79671 Pain in right foot: Secondary | ICD-10-CM

## 2016-09-26 DIAGNOSIS — B351 Tinea unguium: Secondary | ICD-10-CM | POA: Diagnosis not present

## 2016-09-26 DIAGNOSIS — M79672 Pain in left foot: Secondary | ICD-10-CM | POA: Diagnosis not present

## 2016-09-26 NOTE — Progress Notes (Signed)
Subjective: 61 year old female presents requesting nails and calluses trimmed.  Lesions under the bottom of both feet are very painful to walk.   HPI: Has done well sinceRight TAL surgery (02/25/15). 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 Not able to have surgery at this time. Having intermittent pain and swelling right ankle.  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   Objective: Dermatologic: Thick dystrophic nails x 10. Deep circular porokeratotic lesions painful under the center right foot and heel on left foot. Collapsing Medial longitudinal arch. Pronating STJ bilateral.  Assessment: Onychomycosis, symptomatic.  Porokeratosis plantar bilateral, symptomatic.  Plan: Debrided all nails. Debrided all painful porokeratotic lesions bilateral.  Return in 3 month.

## 2016-09-26 NOTE — Patient Instructions (Signed)
Seen for hypertrophic nails and painful callus on both feet. All nails and calluses debrided. Return in 3 months or as needed.

## 2016-12-27 ENCOUNTER — Ambulatory Visit: Payer: Medicare Other | Admitting: Podiatry

## 2017-01-04 ENCOUNTER — Other Ambulatory Visit: Payer: Self-pay | Admitting: Internal Medicine

## 2017-01-04 DIAGNOSIS — Z1231 Encounter for screening mammogram for malignant neoplasm of breast: Secondary | ICD-10-CM

## 2017-01-09 ENCOUNTER — Encounter: Payer: Self-pay | Admitting: Podiatry

## 2017-01-09 ENCOUNTER — Ambulatory Visit (INDEPENDENT_AMBULATORY_CARE_PROVIDER_SITE_OTHER): Payer: Medicare Other | Admitting: Podiatry

## 2017-01-09 DIAGNOSIS — B351 Tinea unguium: Secondary | ICD-10-CM

## 2017-01-09 DIAGNOSIS — M79672 Pain in left foot: Secondary | ICD-10-CM

## 2017-01-09 DIAGNOSIS — M79671 Pain in right foot: Secondary | ICD-10-CM

## 2017-01-09 NOTE — Progress Notes (Signed)
Subjective: 61 year old female presents requesting nails trimmed.  Denies any new problems.  HPI: Has done well sinceRight TAL surgery (02/25/15). 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 Not able to have surgery at this time. Having intermittent pain and swelling right ankle.  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   Objective: Dermatologic: Thick dystrophic nails x 10. Deep circular porokeratotic lesions painful under the center right foot and heel on left foot. Collapsing Medial longitudinal arch. Pronating STJ bilateral.  Assessment: Onychomycosis, symptomatic.   Plan: Debrided all nails. Return in 3 month.

## 2017-01-09 NOTE — Patient Instructions (Signed)
Seen for hypertrophic nails. All nails debrided. Return in 3 months or as needed.  

## 2017-02-13 ENCOUNTER — Ambulatory Visit
Admission: RE | Admit: 2017-02-13 | Discharge: 2017-02-13 | Disposition: A | Discharge status: Home / Self Care | Payer: Medicare Other | Source: Ambulatory Visit | Attending: Internal Medicine | Admitting: Internal Medicine

## 2017-02-13 DIAGNOSIS — Z1231 Encounter for screening mammogram for malignant neoplasm of breast: Secondary | ICD-10-CM

## 2017-02-14 ENCOUNTER — Ambulatory Visit (HOSPITAL_COMMUNITY)
Admission: EM | Admit: 2017-02-14 | Discharge: 2017-02-14 | Disposition: A | Discharge status: Home / Self Care | Payer: Medicare Other | Attending: Emergency Medicine | Admitting: Emergency Medicine

## 2017-02-14 ENCOUNTER — Ambulatory Visit (INDEPENDENT_AMBULATORY_CARE_PROVIDER_SITE_OTHER): Payer: Medicare Other

## 2017-02-14 ENCOUNTER — Encounter (HOSPITAL_COMMUNITY): Payer: Self-pay | Admitting: Family Medicine

## 2017-02-14 DIAGNOSIS — R05 Cough: Secondary | ICD-10-CM | POA: Diagnosis present

## 2017-02-14 DIAGNOSIS — R0602 Shortness of breath: Secondary | ICD-10-CM | POA: Diagnosis not present

## 2017-02-14 DIAGNOSIS — R0601 Orthopnea: Secondary | ICD-10-CM | POA: Insufficient documentation

## 2017-02-14 DIAGNOSIS — I69351 Hemiplegia and hemiparesis following cerebral infarction affecting right dominant side: Secondary | ICD-10-CM | POA: Insufficient documentation

## 2017-02-14 DIAGNOSIS — J45909 Unspecified asthma, uncomplicated: Secondary | ICD-10-CM | POA: Insufficient documentation

## 2017-02-14 DIAGNOSIS — I252 Old myocardial infarction: Secondary | ICD-10-CM | POA: Diagnosis not present

## 2017-02-14 DIAGNOSIS — R351 Nocturia: Secondary | ICD-10-CM | POA: Diagnosis not present

## 2017-02-14 DIAGNOSIS — R600 Localized edema: Secondary | ICD-10-CM | POA: Diagnosis not present

## 2017-02-14 DIAGNOSIS — N3281 Overactive bladder: Secondary | ICD-10-CM | POA: Insufficient documentation

## 2017-02-14 DIAGNOSIS — Z79899 Other long term (current) drug therapy: Secondary | ICD-10-CM | POA: Insufficient documentation

## 2017-02-14 DIAGNOSIS — J441 Chronic obstructive pulmonary disease with (acute) exacerbation: Secondary | ICD-10-CM

## 2017-02-14 DIAGNOSIS — E669 Obesity, unspecified: Secondary | ICD-10-CM | POA: Diagnosis not present

## 2017-02-14 DIAGNOSIS — R062 Wheezing: Secondary | ICD-10-CM

## 2017-02-14 DIAGNOSIS — M170 Bilateral primary osteoarthritis of knee: Secondary | ICD-10-CM | POA: Insufficient documentation

## 2017-02-14 DIAGNOSIS — F17210 Nicotine dependence, cigarettes, uncomplicated: Secondary | ICD-10-CM | POA: Insufficient documentation

## 2017-02-14 LAB — POCT I-STAT, CHEM 8
BUN: 13 mg/dL (ref 6–20)
Calcium, Ion: 1.14 mmol/L — ABNORMAL LOW (ref 1.15–1.40)
Chloride: 106 mmol/L (ref 101–111)
Creatinine, Ser: 0.9 mg/dL (ref 0.44–1.00)
Glucose, Bld: 99 mg/dL (ref 65–99)
HCT: 39 % (ref 36.0–46.0)
Hemoglobin: 13.3 g/dL (ref 12.0–15.0)
Potassium: 5 mmol/L (ref 3.5–5.1)
Sodium: 141 mmol/L (ref 135–145)
TCO2: 28 mmol/L (ref 22–32)

## 2017-02-14 LAB — BRAIN NATRIURETIC PEPTIDE: B Natriuretic Peptide: 22 pg/mL (ref 0.0–100.0)

## 2017-02-14 MED ORDER — AZITHROMYCIN 500 MG PO TABS: 500.0000 mg | ORAL_TABLET | Freq: Every day | ORAL | 0 refills | Status: AC

## 2017-02-14 MED ORDER — IPRATROPIUM-ALBUTEROL 0.5-2.5 (3) MG/3ML IN SOLN
RESPIRATORY_TRACT | Status: AC
  Filled 2017-02-14: qty 3

## 2017-02-14 MED ORDER — PREDNISONE 50 MG PO TABS: 50.0000 mg | ORAL_TABLET | Freq: Every day | ORAL | 0 refills | Status: DC

## 2017-02-14 MED ORDER — PREDNISONE 20 MG PO TABS
60.0000 mg | ORAL_TABLET | Freq: Once | ORAL | Status: AC
  Administered 2017-02-14: 60 mg via ORAL

## 2017-02-14 MED ORDER — IPRATROPIUM-ALBUTEROL 0.5-2.5 (3) MG/3ML IN SOLN
3.0000 mL | Freq: Once | RESPIRATORY_TRACT | Status: AC
  Administered 2017-02-14: 3 mL via RESPIRATORY_TRACT

## 2017-02-14 MED ORDER — AEROCHAMBER PLUS MISC: 2 refills | Status: DC

## 2017-02-14 MED ORDER — PREDNISONE 20 MG PO TABS
ORAL_TABLET | ORAL | Status: AC
  Filled 2017-02-14: qty 3

## 2017-02-14 NOTE — ED Triage Notes (Signed)
Pt here for cough, congestion, sore throat x 2 weeks.

## 2017-02-14 NOTE — Discharge Instructions (Signed)
Use your spacer with the albuterol inhaler.  2 puffs from an albuterol inhaler every 4-6 hours as needed in the next several days, prednisone, 500 mg of azithromycin for 5 days.  Will need to follow-up with her primary care physician in several days, to the ER if she gets worse.

## 2017-02-14 NOTE — ED Provider Notes (Signed)
HPI  SUBJECTIVE:  Jasmine Estrada is a 61 y.o. female who presents with cough productive of whitish yellowish sputum for 2 weeks, shortness of breath, wheezing, nasal congestion, rhinorrhea, postnasal drip, sore scratchy throat secondary to coughing.  States that she feels feverish, but does not have a thermometer at home.  She reports decreasing exercise tolerance, dyspnea on exertion to less than 15 feet, lower extremity edema, 3 pillow orthopnea, nocturia.  No unintentional weight gain, abdominal pain.  Reports chest soreness secondary to the coughing, denies chest pain, pressure, heaviness.  She has never had symptoms like this before.  She has tried multiple OTC medications and is using her albuterol inhaler up to 6-7 times a day.  She normally uses it twice a day.  She is using her Qvar four times a day, normally uses it twice a day.  There are no alleviating factors.  Symptoms are worse with exertion.  She has a past medical history of stroke, asthma, has been smoking half a pack a day for 48 years.  Remote history of MI.  She is prescribed Lasix but is not sure why.  She states that she has not taken it "in a long time".  No history of diabetes, hypertension, CHF, atrial fibrillation, DVT, PE.  PMD: Nolene Ebbs, MD   Past Medical History:  Diagnosis Date  . Arthritis    legs  . Asthma    prn inhaler  . Cough 07/15/2012  . Essential and other specified forms of tremor 01/26/2013  . Full dentures   . Mass of knee 07/2012   left  . Memory difficulty 01/26/2013  . Obesity   . Overactive bladder    states suspicion of bladder cancer  . Stroke [facility])    right sided-weakness  . Tremor    right foot  . Urinary frequency     Past Surgical History:  Procedure Laterality Date  . ACHILLES TENDON LENGTHENING Right 02/25/2015  . MASS EXCISION Left 07/21/2012   Procedure: EXCISION OF LARGE MASS LEFT KNEE WITH LIPOSUCTION ASSISTED;  Surgeon: Cristine Polio, MD;  Location: [facility];  Service: Plastics;  Laterality: Left;  . TUBAL LIGATION  1982    Family History  Problem Relation Age of Onset  . Cancer Sister   . Diabetes Sister   . Mental illness Brother   . Alzheimer's disease Maternal Aunt   . Alzheimer's disease Maternal Grandmother   . Mental illness Sister     Social History  Substance Use Topics  . Smoking status: Current Every Day Smoker    Packs/day: 0.50    Years: 44.00    Types: Cigarettes  . Smokeless tobacco: Never Used     Comment: 1 pack every 4 days  . Alcohol use No    No current facility-administered medications for this encounter.   Current Outpatient Prescriptions:  .  albuterol (PROVENTIL HFA;VENTOLIN HFA) 108 (90 BASE) MCG/ACT inhaler, Inhale 2 puffs into the lungs every 6 (six) hours as needed for wheezing., Disp: , Rfl:  .  atorvastatin (LIPITOR) 20 MG tablet, Take 20 mg by mouth., Disp: , Rfl:  .  azithromycin (ZITHROMAX) 500 MG tablet, Take 1 tablet (500 mg total) by mouth daily. 2 tabs po on day one, then one tablet po once daily on days 2-5., Disp: 5 tablet, Rfl: 0 .  beclomethasone (QVAR) 80 MCG/ACT inhaler, Inhale into the lungs., Disp: , Rfl:  .  diclofenac (VOLTAREN) 75 MG EC tablet, Take 75 mg by  mouth., Disp: , Rfl:  .  fesoterodine (TOVIAZ) 8 MG TB24 tablet, Take 8 mg by mouth daily., Disp: , Rfl:  .  furosemide (LASIX) 20 MG tablet, Take 20 mg by mouth., Disp: , Rfl:  .  gabapentin (NEURONTIN) 100 MG capsule, Take 100 mg by mouth 3 (three) times daily., Disp: , Rfl:  .  hydrOXYzine (ATARAX/VISTARIL) 25 MG tablet, Take 25 mg by mouth., Disp: , Rfl:  .  nitrofurantoin, macrocrystal-monohydrate, (MACROBID) 100 MG capsule, Take 100 mg by mouth., Disp: , Rfl:  .  phentermine 37.5 MG capsule, Take 37.5 mg by mouth every morning., Disp: , Rfl:  .  predniSONE (DELTASONE) 50 MG tablet, Take 1 tablet (50 mg total) by mouth daily with breakfast., Disp: 5 tablet, Rfl: 0 .  pregabalin (LYRICA) 75 MG capsule, Take 75 mg by  mouth., Disp: , Rfl:  .  Spacer/Aero-Holding Chambers (AEROCHAMBER PLUS) inhaler, Use as instructed, Disp: 1 each, Rfl: 2  No Known Allergies   ROS  As noted in HPI.   Physical Exam  BP 108/76   Pulse 98   Temp 98.1 F (36.7 C)   Resp (!) 24   SpO2 97%   Constitutional: Well developed, well nourished,, tachypneic, dyspneic with speaking.  Speaking in short sentences Eyes: PERRL, EOMI, conjunctiva normal bilaterally HENT: Normocephalic, atraumatic,mucus membranes moist Respiratory: Clear to auscultation bilaterally, no rales, no wheezing, no rhonchi.  Positive diffuse chest wall tenderness Cardiovascular: Normal rate and rhythm, no murmurs, no gallops, no rubs, no JVD GI: Soft, nondistended, normal bowel sounds, nontender, no rebound, no guarding skin: No rash, skin intact Musculoskeletal: 1+ edema bilaterally, symmetric no tenderness, no deformities Neurologic: Alert & oriented x 3, CN II-XII grossly intact, no motor deficits, sensation grossly intact Psychiatric: Speech and behavior appropriate   ED Course   Medications  ipratropium-albuterol (DUONEB) 0.5-2.5 (3) MG/3ML nebulizer solution 3 mL (3 mLs Nebulization Given 02/14/17 1229)  predniSONE (DELTASONE) tablet 60 mg (60 mg Oral Given 02/14/17 1229)    Orders Placed This Encounter  Procedures  . DG Chest 2 View    Standing Status:   Standing    Number of Occurrences:   1    Order Specific Question:   Reason for Exam (SYMPTOM  OR DIAGNOSIS REQUIRED)    Answer:   r/o PNA, pulm edema, effusion, cardiomegaly  . Brain natriuretic peptide    Standing Status:   Standing    Number of Occurrences:   1  . I-STAT, chem 8    Standing Status:   Standing    Number of Occurrences:   1   Results for orders placed or performed during the hospital encounter of 02/14/17 (from the past 24 hour(s))  Brain natriuretic peptide     Status: None   Collection Time: 02/14/17 12:40 PM  Result Value Ref Range   B Natriuretic Peptide  22.0 0.0 - 100.0 pg/mL  I-STAT, chem 8     Status: Abnormal   Collection Time: 02/14/17 12:46 PM  Result Value Ref Range   Sodium 141 135 - 145 mmol/L   Potassium 5.0 3.5 - 5.1 mmol/L   Chloride 106 101 - 111 mmol/L   BUN 13 6 - 20 mg/dL   Creatinine, Ser 0.90 0.44 - 1.00 mg/dL   Glucose, Bld 99 65 - 99 mg/dL   Calcium, Ion 1.14 (L) 1.15 - 1.40 mmol/L   TCO2 28 22 - 32 mmol/L   Hemoglobin 13.3 12.0 - 15.0 g/dL   HCT 39.0 36.0 -  46.0 %    ED Clinical Impression  COPD exacerbation [facility])   ED Assessment/Plan  Patient symptoms are concerning for CHF, however, her lungs sound clear and she does have URI like symptoms which could be triggering off a COPD/asthma exacerbation.  Doubt PE.  Discussed with patient that I would not be able to rule out CHF here in the urgent care and discussed with her the possibility of going down to the emergency department for this.  She has declined to do so.  She would much rather be treated as if this was a COPD exacerbation and states that she will go immediately to the ER if not getting better in 24 hours.   Will give DuoNeb, 60 mg of prednisone, check chest x-ray.  Will reevaluate.  Checking renal function and BNP.  We will follow up on the BMP  I-STAT unremarkable.  X-ray independently reviewed.  No effusion, opacities, edema.  See radiology report for details.  Report not crossing over at this time.    On reevaluation, patient states that she feels significantly better.  She is no longer tachypneic or dyspneic.  Lungs still clear, improved air movement.  We will treat as a COPD exacerbation. Discussed with patient that she will need to give [place] a working phone number so that we can contact her if her BNP is positive and she agrees to go immediately to the immediate emergency department if it is. Phone number [phone]   BNP negative.  Home with spacer, 2 puffs from her albuterol inhaler every 4-6 hours as needed in the next several days,  prednisone, 500 mg of azithromycin for 5 days because she has had this for 2 weeks and is reporting subjective fevers.  States she does not need a prescription for albuterol.  Will need to follow-up with her primary care physician in several days, to the ER if she gets worse.  Discussed labs, imaging, MDM, plan and followup with patient Discussed sn/sx that should prompt return to the ED. patient agrees with plan.   Meds ordered this encounter  Medications  . ipratropium-albuterol (DUONEB) 0.5-2.5 (3) MG/3ML nebulizer solution 3 mL  . predniSONE (DELTASONE) tablet 60 mg  . predniSONE (DELTASONE) 50 MG tablet    Sig: Take 1 tablet (50 mg total) by mouth daily with breakfast.    Dispense:  5 tablet    Refill:  0  . Spacer/Aero-Holding Chambers (AEROCHAMBER PLUS) inhaler    Sig: Use as instructed    Dispense:  1 each    Refill:  2  . azithromycin (ZITHROMAX) 500 MG tablet    Sig: Take 1 tablet (500 mg total) by mouth daily. 2 tabs po on day one, then one tablet po once daily on days 2-5.    Dispense:  5 tablet    Refill:  0    *This clinic note was created using [employer]. Therefore, there may be occasional mistakes despite careful proofreading.  ?    Melynda Ripple, MD 02/14/17 [phone]

## 2017-04-10 ENCOUNTER — Ambulatory Visit: Payer: Medicare Other | Admitting: Podiatry

## 2017-04-16 DIAGNOSIS — N3281 Overactive bladder: Secondary | ICD-10-CM

## 2017-04-16 HISTORY — DX: Overactive bladder: N32.81

## 2017-04-25 ENCOUNTER — Ambulatory Visit: Payer: Medicare Other | Admitting: Podiatry

## 2017-05-01 ENCOUNTER — Encounter: Payer: Self-pay | Admitting: Podiatry

## 2017-05-01 ENCOUNTER — Ambulatory Visit (INDEPENDENT_AMBULATORY_CARE_PROVIDER_SITE_OTHER): Payer: Medicare Other | Admitting: Podiatry

## 2017-05-01 DIAGNOSIS — B351 Tinea unguium: Secondary | ICD-10-CM | POA: Diagnosis not present

## 2017-05-01 DIAGNOSIS — M79671 Pain in right foot: Secondary | ICD-10-CM | POA: Diagnosis not present

## 2017-05-01 DIAGNOSIS — L600 Ingrowing nail: Secondary | ICD-10-CM | POA: Diagnosis not present

## 2017-05-01 DIAGNOSIS — M79672 Pain in left foot: Secondary | ICD-10-CM | POA: Diagnosis not present

## 2017-05-01 NOTE — Patient Instructions (Signed)
Seen for hypertrophic nails, ingrown nail on right great toe and left foot callus. All nails and calluses debrided. May benefit from ingrown nail surgery. Return in 3 months or as needed.

## 2017-05-01 NOTE — Progress Notes (Signed)
Subjective: 62 y.o. year old female patient presents complaining of painful nails. Patient requests toe nails, corns and calluses trimmed.   HPI: Got another stroke 7 month ago and resulted in right side weak. Has done well sinceRight TAL surgery (02/25/15). 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 Not able to have surgery at this time. Having intermittent pain and swelling right ankle.  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   Objective: Dermatologic: Thick yellow deformed nails x 10. Ingrown hallucal nail both border right great toe. Plantar callus under left great toe. Deep painful porokeratosis under left heel painful. Vascular: Pedal pulses are all palpable. Orthopedic: Collapsing medial longitudinal arch bilateral. Excess STJ pronation bilateral. Neurologic: All epicritic and tactile sensations grossly intact.  Assessment: Dystrophic mycotic nails x 10. Ingrown hallucal nail on both borders right great toe. Intractable porokeratosis left plantar heel.  Treatment: All mycotic nails, corns, calluses debrided.  Reviewed benefit of ingrown nail surgery on right great toe on both borders. Return in 3 months or as needed.

## 2017-07-30 ENCOUNTER — Ambulatory Visit: Payer: Medicare Other | Admitting: Podiatry

## 2017-07-31 ENCOUNTER — Ambulatory Visit (INDEPENDENT_AMBULATORY_CARE_PROVIDER_SITE_OTHER): Payer: Medicare HMO | Admitting: Podiatry

## 2017-07-31 ENCOUNTER — Encounter: Payer: Self-pay | Admitting: Podiatry

## 2017-07-31 DIAGNOSIS — M79671 Pain in right foot: Secondary | ICD-10-CM | POA: Diagnosis not present

## 2017-07-31 DIAGNOSIS — M79672 Pain in left foot: Secondary | ICD-10-CM

## 2017-07-31 DIAGNOSIS — L600 Ingrowing nail: Secondary | ICD-10-CM

## 2017-07-31 DIAGNOSIS — B351 Tinea unguium: Secondary | ICD-10-CM

## 2017-07-31 NOTE — Progress Notes (Signed)
Subjective: 62 y.o. year old female patient presents complaining of painful nails. Patient requests toe nails trimmed.   HPI: Got another stroke 7 month ago and resulted in right side weak. Has done well sinceRight TAL surgery (02/25/15). 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 Not able to have surgery at this time. Having intermittent pain and swelling right ankle.  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   Objective: Dermatologic: Thick yellow deformed nails x 10. Severely ingrown right great toe both borders. Vascular: Pedal pulses are all palpable. Orthopedic: Excess STJ pronation with collapsing MLA. Neurologic: All epicritic and tactile sensations grossly intact.  Assessment: Dystrophic mycotic nails x 10.  Treatment: All mycotic nails debrided.  Need ingrown nail surgery on right great toe both borders. Patient is to call and set it up.

## 2017-07-31 NOTE — Patient Instructions (Signed)
Seen for hypertrophic nails. Severely ingrown right great toe nail. All nails debrided. Call and set up for ingrown nail surgery on right great toe.

## 2017-08-14 ENCOUNTER — Ambulatory Visit (INDEPENDENT_AMBULATORY_CARE_PROVIDER_SITE_OTHER): Payer: Medicare HMO | Admitting: Podiatry

## 2017-08-14 ENCOUNTER — Encounter: Payer: Self-pay | Admitting: Podiatry

## 2017-08-14 VITALS — BP 140/83 | HR 88 | Ht 67.0 in | Wt 234.0 lb

## 2017-08-14 DIAGNOSIS — M79604 Pain in right leg: Secondary | ICD-10-CM

## 2017-08-14 DIAGNOSIS — L600 Ingrowing nail: Secondary | ICD-10-CM | POA: Diagnosis not present

## 2017-08-14 MED ORDER — HYDROCODONE-IBUPROFEN 7.5-200 MG PO TABS: 1.0000 | ORAL_TABLET | Freq: Four times a day (QID) | ORAL | 0 refills | Status: DC | PRN

## 2017-08-14 NOTE — Progress Notes (Signed)
Subjective: 62 y.o. year old female patient presents for scheduled toe nail surgery on right great toe both borders. She was seen on 07/31/17 for painful ingrown nail and available treatment options revealed. Patient opt to come back for nail surgery.  HPI: Got another stroke 7 month ago and resulted in right side weak. Has done well sinceRight TAL surgery (02/25/15). 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 Not able to have surgery at this time. Having intermittent pain and swelling right ankle.  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  Objective: Dermatologic: Thick yellow deformed nails x 10. Severely ingrown right great toe both borders. Vascular: Pedal pulses are all palpable. Orthopedic: Excess STJ pronation with collapsing MLA. Neurologic: All epicritic and tactile sensations grossly intact.  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  Treatment: Procedure done: Phenol and Alcohol matrixectomy right great toe both border. Right great toe was anesthetized with total 32ml mixture of 50/50 0.5% Marcaine plain and 1% Xylocaine plain. Affected both nail border was reflected with a nail elevator and excised with nail nipper. On both borders of proximal nail matrix tissue was cauterized with Phenol soaked cotton applicator x 4 and neutralized with Alcohol soaked cotton applicator. The wound was dressed with Amerigel ointment dressing. Home care instructions and supply dispensed.  Pain medication prescribed. Return in 1 week for follow up.

## 2017-08-14 NOTE — Patient Instructions (Signed)
Ingrown nail surgery was done on right great toe both borders.. Follow soaking instruction.  Some redness and drainage is expected. Call the office if the area gets feverish with increased redness and drainage. Return in one week.  

## 2017-08-21 ENCOUNTER — Encounter: Payer: Self-pay | Admitting: Podiatry

## 2017-08-21 ENCOUNTER — Ambulatory Visit (INDEPENDENT_AMBULATORY_CARE_PROVIDER_SITE_OTHER): Payer: Medicare HMO | Admitting: Podiatry

## 2017-08-21 DIAGNOSIS — L600 Ingrowing nail: Secondary | ICD-10-CM

## 2017-08-21 NOTE — Progress Notes (Signed)
1 week follow up on right great toe nail surgery both border. Nail borders are dry but some inflamed proximal skin folder. Continue soaking. Return if pain, swelling or redness increase.

## 2017-08-21 NOTE — Patient Instructions (Addendum)
1 week follow up on right great toe nail surgery both border. Nail borders are dry but some inflamed proximal skin folder. Continue soaking. Return if pain, swelling or redness increase.

## 2017-11-02 ENCOUNTER — Ambulatory Visit (HOSPITAL_COMMUNITY)
Admission: EM | Admit: 2017-11-02 | Discharge: 2017-11-02 | Disposition: A | Discharge status: Home / Self Care | Payer: Medicare HMO | Attending: Family Medicine | Admitting: Family Medicine

## 2017-11-02 ENCOUNTER — Encounter (HOSPITAL_COMMUNITY): Payer: Self-pay | Admitting: *Deleted

## 2017-11-02 ENCOUNTER — Other Ambulatory Visit: Payer: Self-pay

## 2017-11-02 DIAGNOSIS — N3091 Cystitis, unspecified with hematuria: Secondary | ICD-10-CM | POA: Diagnosis not present

## 2017-11-02 DIAGNOSIS — Z79899 Other long term (current) drug therapy: Secondary | ICD-10-CM | POA: Diagnosis not present

## 2017-11-02 DIAGNOSIS — F17210 Nicotine dependence, cigarettes, uncomplicated: Secondary | ICD-10-CM | POA: Diagnosis not present

## 2017-11-02 DIAGNOSIS — Z818 Family history of other mental and behavioral disorders: Secondary | ICD-10-CM | POA: Diagnosis not present

## 2017-11-02 DIAGNOSIS — Z8673 Personal history of transient ischemic attack (TIA), and cerebral infarction without residual deficits: Secondary | ICD-10-CM | POA: Diagnosis not present

## 2017-11-02 DIAGNOSIS — M1990 Unspecified osteoarthritis, unspecified site: Secondary | ICD-10-CM | POA: Insufficient documentation

## 2017-11-02 DIAGNOSIS — Z7951 Long term (current) use of inhaled steroids: Secondary | ICD-10-CM | POA: Insufficient documentation

## 2017-11-02 DIAGNOSIS — R300 Dysuria: Secondary | ICD-10-CM

## 2017-11-02 DIAGNOSIS — Z7982 Long term (current) use of aspirin: Secondary | ICD-10-CM | POA: Insufficient documentation

## 2017-11-02 DIAGNOSIS — E669 Obesity, unspecified: Secondary | ICD-10-CM | POA: Diagnosis not present

## 2017-11-02 DIAGNOSIS — R319 Hematuria, unspecified: Secondary | ICD-10-CM | POA: Diagnosis not present

## 2017-11-02 DIAGNOSIS — J45909 Unspecified asthma, uncomplicated: Secondary | ICD-10-CM | POA: Insufficient documentation

## 2017-11-02 DIAGNOSIS — N3090 Cystitis, unspecified without hematuria: Secondary | ICD-10-CM

## 2017-11-02 DIAGNOSIS — Z6833 Body mass index (BMI) 33.0-33.9, adult: Secondary | ICD-10-CM | POA: Diagnosis not present

## 2017-11-02 DIAGNOSIS — N3281 Overactive bladder: Secondary | ICD-10-CM | POA: Diagnosis not present

## 2017-11-02 DIAGNOSIS — Z833 Family history of diabetes mellitus: Secondary | ICD-10-CM | POA: Diagnosis not present

## 2017-11-02 LAB — POCT URINALYSIS DIP (DEVICE)
Bilirubin Urine: NEGATIVE
Glucose, UA: NEGATIVE mg/dL
Ketones, ur: NEGATIVE mg/dL
Nitrite: NEGATIVE
Protein, ur: 30 mg/dL — AB
Specific Gravity, Urine: 1.015 (ref 1.005–1.030)
Urobilinogen, UA: 0.2 mg/dL (ref 0.0–1.0)
pH: 6 (ref 5.0–8.0)

## 2017-11-02 MED ORDER — CEPHALEXIN 500 MG PO CAPS: 500.0000 mg | ORAL_CAPSULE | Freq: Two times a day (BID) | ORAL | 0 refills | Status: AC

## 2017-11-02 MED ORDER — PHENAZOPYRIDINE HCL 200 MG PO TABS: 200.0000 mg | ORAL_TABLET | Freq: Three times a day (TID) | ORAL | 0 refills | Status: DC

## 2017-11-02 NOTE — Discharge Instructions (Signed)
Your urine was positive for an urinary tract infection. Start keflex as directed. Pyridium for painful urinating. As discussed, this medicine can turn your urine orange. Keep hydrated. As discussed, there is a possibility of kidney stones given blood in urine, since it is not causing flank/kidney pain, if there is a stone, it will slowly come out with urination. Monitor for any worsening of symptoms, fever, worsening abdominal pain, nausea/vomiting, flank pain, follow up for reevaluation. Otherwise, follow up with urologist as scheduled for reevaluation needed.

## 2017-11-02 NOTE — ED Notes (Signed)
Pt discharged by provider.

## 2017-11-02 NOTE — ED Provider Notes (Signed)
[place]    CSN: [id number] Arrival date & time: 11/02/17  1001     History   Chief Complaint Chief Complaint  Patient presents with  . Urinary Tract Infection    HPI Jasmine Estrada is a 62 y.o. female.   62 year old female with history of asthma, CVA, overactive bladder comes in for 1 week history of hematuria and dysuria.  States she already urinates frequently due to overactive bladder, but recently has had more painful urination.  States she has slight blood on wiping.  States some suprapubic abdominal pain/pressure while urinating, otherwise no abdominal pain.  Denies nausea, vomiting.  Denies fever, chills, night sweats.  Denies vaginal discharge, vaginal bleeding, irritation.  She is currently seeing urology for overactive bladder, states next step may be Botox injection, and has an appointment in 4 days.  States many years ago, was mentioned by past urologist of possible bladder cancer, but has never been fully evaluated. Does not think current urologist knows of this.      Past Medical History:  Diagnosis Date  . Arthritis    legs  . Asthma    prn inhaler  . Cough 07/15/2012  . Essential and other specified forms of tremor 01/26/2013  . Full dentures   . Mass of knee 07/2012   left  . Memory difficulty 01/26/2013  . Obesity   . Overactive bladder    states suspicion of bladder cancer  . Stroke [facility])    right sided-weakness  . Tremor    right foot  . Urinary frequency     Patient Active Problem List   Diagnosis Date Noted  . Ankle edema 04/12/2015  . Status post right foot surgery 03/02/2015  . Equinus deformity of foot, acquired 02/02/2015  . Tenosynovitis of ankle 01/03/2015  . Posterior tibial tendon dysfunction 01/03/2015  . Onychomycosis 01/03/2015  . Lumbago 08/12/2013  . Pain in limb 05/25/2013  . Essential and other specified forms of tremor 01/26/2013  . Memory difficulty 01/26/2013    Past Surgical History:  Procedure  Laterality Date  . ACHILLES TENDON LENGTHENING Right 02/25/2015  . MASS EXCISION Left 07/21/2012   Procedure: EXCISION OF LARGE MASS LEFT KNEE WITH LIPOSUCTION ASSISTED;  Surgeon: Cristine Polio, MD;  Location: [place];  Service: Plastics;  Laterality: Left;  . TUBAL LIGATION  1982    OB History   None      Home Medications    Prior to Admission medications   Medication Sig Start Date End Date Taking? Authorizing Provider  acetaminophen-codeine (TYLENOL #2) 300-15 MG tablet Take 1 tablet by mouth every 4 (four) hours as needed for moderate pain.   Yes Emergency, Nurse, RN  aspirin 325 MG tablet Take 325 mg by mouth daily.   Yes Emergency, Nurse, RN  fesoterodine (TOVIAZ) 8 MG TB24 tablet Take 8 mg by mouth daily.    Yes [provider]  oxyCODONE-acetaminophen (PERCOCET/ROXICET) 5-325 MG tablet Take by mouth every 4 (four) hours as needed for severe pain.   Yes Emergency, Nurse, RN  phentermine 37.5 MG capsule Take 37.5 mg by mouth every morning.   Yes [provider]  Spacer/Aero-Holding Chambers (AEROCHAMBER PLUS) inhaler Use as instructed 02/14/17  Yes Melynda Ripple, MD  albuterol (PROVENTIL HFA;VENTOLIN HFA) 108 (90 BASE) MCG/ACT inhaler Inhale 2 puffs into the lungs every 6 (six) hours as needed for wheezing.    [provider]  atorvastatin (LIPITOR) 20 MG tablet Take 20 mg by mouth.  [provider]  beclomethasone (QVAR) 80 MCG/ACT inhaler Inhale into the lungs.    [provider]  cephALEXin (KEFLEX) 500 MG capsule Take 1 capsule (500 mg total) by mouth 2 (two) times daily for 7 days. 11/02/17 11/09/17  Tasia Catchings, Altha Sweitzer V, PA-C  furosemide (LASIX) 20 MG tablet Take 20 mg by mouth.    [provider]  gabapentin (NEURONTIN) 100 MG capsule Take 100 mg by mouth 3 (three) times daily.    [provider]  phenazopyridine (PYRIDIUM) 200 MG tablet Take 1 tablet (200 mg total) by mouth 3 (three) times daily.  11/02/17   Ok Edwards, PA-C    Family History Family History  Problem Relation Age of Onset  . Cancer Sister   . Diabetes Sister   . Mental illness Brother   . Alzheimer's disease Maternal Aunt   . Alzheimer's disease Maternal Grandmother   . Mental illness Sister     Social History Social History   Tobacco Use  . Smoking status: Current Every Day Smoker    Packs/day: 0.50    Years: 44.00    Pack years: 22.00    Types: Cigarettes  . Smokeless tobacco: Never Used  . Tobacco comment: 1 pack every 4 days  Substance Use Topics  . Alcohol use: No    Alcohol/week: 0.0 oz  . Drug use: No     Allergies   Patient has no known allergies.   Review of Systems Review of Systems  Reason unable to perform ROS: See HPI as above.     Physical Exam Triage Vital Signs ED Triage Vitals  Enc Vitals Group     BP 11/02/17 1011 135/86     Pulse Rate 11/02/17 1011 87     Resp 11/02/17 1011 18     Temp 11/02/17 1011 98.4 F (36.9 C)     Temp Source 11/02/17 1011 Oral     SpO2 11/02/17 1011 97 %     Weight 11/02/17 1008 222 lb (100.7 kg)     Height 11/02/17 1008 5\' 8"  (1.727 m)     Head Circumference --      Peak Flow --      Pain Score 11/02/17 1007 9     Pain Loc --      Pain Edu? --      Excl. in [place]? --    No data found.  Updated Vital Signs BP 135/86 (BP Location: Right Arm)   Pulse 87   Temp 98.4 F (36.9 C) (Oral)   Resp 18   Ht 5\' 8"  (1.727 m)   Wt 222 lb (100.7 kg)   SpO2 97%   BMI 33.75 kg/m   Physical Exam  Constitutional: She is oriented to person, place, and time. She appears well-developed and well-nourished. No distress.  HENT:  Head: Normocephalic and atraumatic.  Eyes: Pupils are equal, round, and reactive to light. Conjunctivae are normal.  Cardiovascular: Normal rate, regular rhythm and normal heart sounds. Exam reveals no gallop and no friction rub.  No murmur heard. Pulmonary/Chest: Effort normal and breath sounds normal. She has no wheezes.  She has no rales.  Abdominal: Soft. Bowel sounds are normal. She exhibits no mass. There is no tenderness. There is no rebound, no guarding and no CVA tenderness.  Neurological: She is alert and oriented to person, place, and time.  Skin: Skin is warm and dry.  Psychiatric: She has a normal mood and affect. Her behavior is normal. Judgment normal.  UC Treatments / Results  Labs (all labs ordered are listed, but only abnormal results are displayed) Labs Reviewed  URINE CULTURE   Urine dipstick Neg glu Neg bil Neg ket Sg 1.015 Moderate blood  pH 6.0 Pro 30 uro 0.2 Large leuk Negative nitrite  EKG None  Radiology No results found.  Procedures Procedures (including critical care time)  Medications Ordered in UC Medications - No data to display  Initial Impression / Assessment and Plan / UC Course  I have reviewed the triage vital signs and the nursing notes.  Pertinent labs & imaging results that were available during my care of the patient were reviewed by me and considered in my medical decision making (see chart for details).    Urine dipstick positive for UTI. Start antibiotics as directed. Pyridium for dysuria. Push fluids.  Discussed possibility of kidney stones given hematuria, however, patient without flank pain at this time, will have patient monitor.  Discussed with patient to inform urologist of possible bladder cancer.  Patient has an appointment with her urologist in 4 days, to follow-up as scheduled for further evaluation and management needed.  Return precautions given.  Patient expresses understanding and agrees to plan.   Final Clinical Impressions(s) / UC Diagnoses   Final diagnoses:  Cystitis    ED Prescriptions    Medication Sig Dispense Auth. Provider   cephALEXin (KEFLEX) 500 MG capsule Take 1 capsule (500 mg total) by mouth 2 (two) times daily for 7 days. 14 capsule Kenya Shiraishi V, PA-C   phenazopyridine (PYRIDIUM) 200 MG tablet Take 1 tablet  (200 mg total) by mouth 3 (three) times daily. 6 tablet Tobin Chad, [place] 11/02/17 1047

## 2017-11-02 NOTE — ED Triage Notes (Signed)
Pt complained of hematuria and dysuria for one week. Pt denies having a UTI previously.

## 2017-11-04 LAB — URINE CULTURE: Culture: 80000 — AB

## 2017-11-05 ENCOUNTER — Telehealth (HOSPITAL_COMMUNITY): Payer: Self-pay

## 2017-11-05 MED ORDER — NITROFURANTOIN MONOHYD MACRO 100 MG PO CAPS: 100.0000 mg | ORAL_CAPSULE | Freq: Two times a day (BID) | ORAL | 0 refills | Status: DC

## 2017-11-05 NOTE — Telephone Encounter (Signed)
Urine culture positive for E.coli. Resistant to Keflex given at urgent care visit. Rx for Macrobid 100 mg BID x 5 days sent to pharmacy of record. Pt called and made aware.

## 2017-11-07 ENCOUNTER — Other Ambulatory Visit: Payer: Self-pay | Admitting: Urology

## 2017-11-12 ENCOUNTER — Other Ambulatory Visit: Payer: Self-pay

## 2017-11-12 ENCOUNTER — Encounter (HOSPITAL_BASED_OUTPATIENT_CLINIC_OR_DEPARTMENT_OTHER): Payer: Self-pay | Admitting: *Deleted

## 2017-11-12 NOTE — Progress Notes (Signed)
Spoke with patient via telephone for pre op interview. NPO after MN. Pt to bring albuterol inhaler with her day of surgery.

## 2017-11-15 NOTE — H&P (Signed)
I was consulted by the above provider to assess the patient's worsening urinary incontinence over number years. She sometimes leaks with coughing standing and bending and lifting. Her primary problem is urgency incontinence. She also has high volume bedwetting. She can wear 3-6 pads a day sometimes they are very wet. She had a stroke and uses a cane.   She voids every 20-30 minutes and sometimes every 60 min but cannot hold urination for 2 hr. She gets up 5 or 6 times a night and has ankle edema but does not take a diuretic   She saw a urologist in [facility] and is on [place]. She is prone to constipation   Well-supported bladder neck and negative cough test no prolapse; mild meatal stenosis   cystoscopy: Normal but a few white flecks in the urine and urine sent for culture   The patient has mixed incontinence but clinically primarily an overactive bladder with urgency incontinence. She does have medical comorbidities. She has significant frequency and nocturia and likely has a nocturnal diuresis. She has high volume bedwetting. I do not think she has an ideal study candidate.   90% of the patient's problem is an overactive bladder. she will stop the Toviaz and I gave her Myrbetriq samples and VESIcare 10 mg prescription. If she fails double therapy in about 5 weeks I will offer neuromodulation treatments but take into account her medical comorbidities and perhaps functional status   She has a lot of medical comorbidities and swollen lower legs. I thought percutaneous tibial nerve stimulation was the best treatment option. I mentioned the other 2 but I do not think her ideal for her. Handout given. The treatment will be ordered and she was very appreciative and is very hopeful to be helped a lot   Today  Urge incontinence persisting. On Macrodantin for recent bladder infection at Urgent Care. Not on any medication. Failed percutaneous tibial nerve stimulation. Still has bedwetting.  Sometimes needs an MRI   Botox with usual template discuss   She would like to proceed. Three day prescription given. Usual protocol   There is no other aggravating or relieving factors  There is no other associated signs and symptoms  The severity of the symptoms is moderate  The symptoms are ongoing and bothersome       ALLERGIES: None   MEDICATIONS: Albuterol Sulfate  Atorvastatin Calcium  Baclofen 10 mg tablet  Beclomethasone Dipropionate  Diclofenac Potassium  Doxepin Hcl  Furosemide  Gabapentin  Hydrocodone-Ibuprofen 7.5 mg-200 mg tablet  Hydroxyzine Hcl  Ibuprofen 800 mg tablet  Nitrofurantoin 100 mg capsule  Phentermine Hcl  Sertraline Hcl  Tizanidine Hcl     GU PSH: Complex cystometrogram, w/ void pressure and urethral pressure profile studies, any technique - 04/19/2017 Complex Uroflow - 04/19/2017 Cystoscopy - 03/28/2017 Emg surf Electrd - 04/19/2017 Inject For cystogram - 04/19/2017 Intrabd voidng Press - 04/19/2017    NON-GU PSH: Bilateral Tubal Ligation - 1984 Neuroeltrd Stim Post Tibial - 09/24/2017, 09/17/2017, 09/10/2017, 09/03/2017, 08/27/2017, 08/20/2017, 08/13/2017, 08/06/2017, 07/30/2017, 07/23/2017, 07/16/2017, 07/09/2017    GU PMH: Mixed incontinence - 03/28/2017 Nocturia - 03/28/2017 Nocturnal Enuresis - 03/28/2017 Urinary Frequency - 03/28/2017    NON-GU PMH: Arthritis Asthma Depression Gout Stroke/TIA    FAMILY HISTORY: 2 daughters - Other 2 sons - Other father deceased - Other mother deceased - Other    Notes: Mother died in a car wreck at 36, Father drowned at 19.    SOCIAL HISTORY: Marital Status: Single  Current Smoking Status: Patient smokes. Has smoked since 03/16/1969. Smokes 1/2 pack per day.   Tobacco Use Assessment Completed: Used Tobacco in last 30 days? Has never drank.  Drinks 2 caffeinated drinks per day.    REVIEW OF SYSTEMS:    GU Review Female:   Patient reports leakage of urine and stream starts and stops. Patient denies  frequent urination, hard to postpone urination, burning /pain with urination, get up at night to urinate, trouble starting your stream, have to strain to urinate, and being pregnant.  Gastrointestinal (Upper):   Patient denies nausea, vomiting, and indigestion/ heartburn.  Gastrointestinal (Lower):   Patient reports constipation. Patient denies diarrhea.  Constitutional:   Patient reports fatigue. Patient denies fever, night sweats, and weight loss.  Skin:   Patient denies skin rash/ lesion and itching.  Eyes:   Patient denies blurred vision and double vision.  Ears/ Nose/ Throat:   Patient denies sore throat and sinus problems.  Hematologic/Lymphatic:   Patient reports easy bruising. Patient denies swollen glands.  Cardiovascular:   Patient reports leg swelling. Patient denies chest pains.  Respiratory:   Patient reports cough. Patient denies shortness of breath.  Endocrine:   Patient denies excessive thirst.  Musculoskeletal:   Patient reports joint pain. Patient denies back pain.  Neurological:   Patient denies headaches and dizziness.  Psychologic:   Patient denies depression and anxiety.   VITAL SIGNS:      11/06/2017 09:08 AM  BP 114/75 mmHg  Pulse 87 /min  Temperature 98.0 F / 36.6 C   PAST DATA REVIEWED:  Source Of History:  Patient   PROCEDURES:         PVR Ultrasound - 29937  Scanned Volume: 0 cc         Urinalysis w/Scope Dipstick Dipstick Cont'd Micro  Color: Yellow Bilirubin: Neg mg/dL WBC/hpf: 10 - 20/hpf  Appearance: Cloudy Ketones: Trace mg/dL RBC/hpf: 0 - 2/hpf  Specific Gravity: 1.025 Blood: 2+ ery/uL Bacteria: Many (>50/hpf)  pH: 5.5 Protein: 1+ mg/dL Cystals: NS (Not Seen)  Glucose: Neg mg/dL Urobilinogen: 1.0 mg/dL Casts: NS (Not Seen)    Nitrites: Neg Trichomonas: Not Present    Leukocyte Esterase: Neg leu/uL Mucous: Present      Epithelial Cells: 6 - 10/hpf      Yeast: NS (Not Seen)      Sperm: Not Present    ASSESSMENT:      ICD-10 Details  1 GU:    Mixed incontinence - N39.46   2   Nocturia - R35.1               Notes:   Pros, cons, success and failure rates of BOTOX were discussed. We talked about off-label usage, durability, and retreatment rates. Risks were described but not limited to the risk of persistent, de novo, or worsening incontinence. We talked about the risk of retention requiring catheterization. We talked about the risk of flow symptoms and high residual urine volumes. Risks of pain, bleeding, infection, and neuropathy were discussed. Rare risks of nerve paralysis and death were discussed. The patient understands that she might not reach her treatment goal and that she might be worse following surgery.    PLAN:            Medications New Meds: Cipro 250 mg tablet 1 tablet PO BID   #6  0 Refill(s)    Stop Meds: Myrbetriq 50 mg tablet, extended release 24 hr  Discontinue: 11/06/2017  - Reason: The medication was ineffective.  Toviaz 8 mg tablet, extended release 24 hr  Discontinue: 11/06/2017  - Reason: The medication was ineffective.  Vesicare 10 mg tablet 1 tablet PO Daily  Start: 05/02/2017  Stop: 04/27/2018   Discontinue: 11/06/2017  - Reason: The medication was ineffective.            Orders Labs Urine Culture          Schedule Return Visit/Planned Activity: Return PRN - Office Visit   After a thorough review of the management options for the patient's condition the patient  elected to proceed with surgical therapy as noted above. We have discussed the potential benefits and risks of the procedure, side effects of the proposed treatment, the likelihood of the patient achieving the goals of the procedure, and any potential problems that might occur during the procedure or recuperation. Informed consent has been obtained.

## 2017-11-19 ENCOUNTER — Ambulatory Visit (HOSPITAL_BASED_OUTPATIENT_CLINIC_OR_DEPARTMENT_OTHER): Payer: Medicare HMO | Admitting: Anesthesiology

## 2017-11-19 ENCOUNTER — Other Ambulatory Visit: Payer: Self-pay

## 2017-11-19 ENCOUNTER — Encounter (HOSPITAL_BASED_OUTPATIENT_CLINIC_OR_DEPARTMENT_OTHER): Payer: Self-pay | Admitting: Emergency Medicine

## 2017-11-19 ENCOUNTER — Encounter (HOSPITAL_BASED_OUTPATIENT_CLINIC_OR_DEPARTMENT_OTHER)
Admission: RE | Disposition: A | Discharge status: Home / Self Care | Payer: Self-pay | Source: Ambulatory Visit | Attending: Urology

## 2017-11-19 ENCOUNTER — Ambulatory Visit (HOSPITAL_BASED_OUTPATIENT_CLINIC_OR_DEPARTMENT_OTHER)
Admission: RE | Admit: 2017-11-19 | Discharge: 2017-11-19 | Disposition: A | Discharge status: Home / Self Care | Payer: Medicare HMO | Source: Ambulatory Visit | Attending: Urology | Admitting: Urology

## 2017-11-19 DIAGNOSIS — Z8673 Personal history of transient ischemic attack (TIA), and cerebral infarction without residual deficits: Secondary | ICD-10-CM | POA: Diagnosis not present

## 2017-11-19 DIAGNOSIS — F17210 Nicotine dependence, cigarettes, uncomplicated: Secondary | ICD-10-CM | POA: Insufficient documentation

## 2017-11-19 DIAGNOSIS — N3941 Urge incontinence: Secondary | ICD-10-CM | POA: Insufficient documentation

## 2017-11-19 DIAGNOSIS — J45909 Unspecified asthma, uncomplicated: Secondary | ICD-10-CM | POA: Diagnosis not present

## 2017-11-19 DIAGNOSIS — Z79899 Other long term (current) drug therapy: Secondary | ICD-10-CM | POA: Insufficient documentation

## 2017-11-19 DIAGNOSIS — F329 Major depressive disorder, single episode, unspecified: Secondary | ICD-10-CM | POA: Diagnosis not present

## 2017-11-19 HISTORY — PX: BOTOX INJECTION: SHX5754

## 2017-11-19 SURGERY — BOTOX INJECTION
Anesthesia: General

## 2017-11-19 MED ORDER — LACTATED RINGERS IV SOLN
INTRAVENOUS | Status: DC
  Administered 2017-11-19: 09:00:00 via INTRAVENOUS
  Filled 2017-11-19: qty 1000

## 2017-11-19 MED ORDER — CIPROFLOXACIN IN D5W 400 MG/200ML IV SOLN
400.0000 mg | INTRAVENOUS | Status: AC
  Administered 2017-11-19: 400 mg via INTRAVENOUS
  Filled 2017-11-19: qty 200

## 2017-11-19 MED ORDER — PROPOFOL 10 MG/ML IV BOLUS
INTRAVENOUS | Status: DC | PRN
  Administered 2017-11-19: 150 mg via INTRAVENOUS

## 2017-11-19 MED ORDER — LIDOCAINE 2% (20 MG/ML) 5 ML SYRINGE
INTRAMUSCULAR | Status: AC
  Filled 2017-11-19: qty 5

## 2017-11-19 MED ORDER — DEXAMETHASONE SODIUM PHOSPHATE 10 MG/ML IJ SOLN
INTRAMUSCULAR | Status: DC | PRN
  Administered 2017-11-19: 10 mg via INTRAVENOUS

## 2017-11-19 MED ORDER — CIPROFLOXACIN IN D5W 400 MG/200ML IV SOLN
INTRAVENOUS | Status: AC
  Filled 2017-11-19: qty 200

## 2017-11-19 MED ORDER — FENTANYL CITRATE (PF) 100 MCG/2ML IJ SOLN
INTRAMUSCULAR | Status: AC
  Filled 2017-11-19: qty 2

## 2017-11-19 MED ORDER — PROPOFOL 10 MG/ML IV BOLUS
INTRAVENOUS | Status: AC
  Filled 2017-11-19: qty 20

## 2017-11-19 MED ORDER — STERILE WATER FOR IRRIGATION IR SOLN
Status: DC | PRN
  Administered 2017-11-19: 3000 mL via INTRAVESICAL

## 2017-11-19 MED ORDER — DEXAMETHASONE SODIUM PHOSPHATE 10 MG/ML IJ SOLN
INTRAMUSCULAR | Status: AC
  Filled 2017-11-19: qty 1

## 2017-11-19 MED ORDER — FENTANYL CITRATE (PF) 100 MCG/2ML IJ SOLN
25.0000 ug | INTRAMUSCULAR | Status: DC | PRN
  Filled 2017-11-19: qty 1

## 2017-11-19 MED ORDER — ROCURONIUM BROMIDE 100 MG/10ML IV SOLN
INTRAVENOUS | Status: AC
  Filled 2017-11-19: qty 1

## 2017-11-19 MED ORDER — ONDANSETRON HCL 4 MG/2ML IJ SOLN
INTRAMUSCULAR | Status: DC | PRN
  Administered 2017-11-19: 4 mg via INTRAVENOUS

## 2017-11-19 MED ORDER — SODIUM CHLORIDE 0.9 % IJ SOLN
INTRAMUSCULAR | Status: DC | PRN
  Administered 2017-11-19: 10 mL

## 2017-11-19 MED ORDER — MIDAZOLAM HCL 2 MG/2ML IJ SOLN
INTRAMUSCULAR | Status: DC | PRN
  Administered 2017-11-19: 1 mg via INTRAVENOUS

## 2017-11-19 MED ORDER — PHENYLEPHRINE 40 MCG/ML (10ML) SYRINGE FOR IV PUSH (FOR BLOOD PRESSURE SUPPORT)
PREFILLED_SYRINGE | INTRAVENOUS | Status: DC | PRN
  Administered 2017-11-19 (×2): 80 ug via INTRAVENOUS
  Administered 2017-11-19: 40 ug via INTRAVENOUS

## 2017-11-19 MED ORDER — PHENYLEPHRINE 40 MCG/ML (10ML) SYRINGE FOR IV PUSH (FOR BLOOD PRESSURE SUPPORT)
PREFILLED_SYRINGE | INTRAVENOUS | Status: AC
  Filled 2017-11-19: qty 10

## 2017-11-19 MED ORDER — ONDANSETRON HCL 4 MG/2ML IJ SOLN
INTRAMUSCULAR | Status: AC
  Filled 2017-11-19: qty 2

## 2017-11-19 MED ORDER — MIDAZOLAM HCL 2 MG/2ML IJ SOLN
INTRAMUSCULAR | Status: AC
  Filled 2017-11-19: qty 2

## 2017-11-19 MED ORDER — LIDOCAINE 2% (20 MG/ML) 5 ML SYRINGE
INTRAMUSCULAR | Status: DC | PRN
  Administered 2017-11-19: 80 mg via INTRAVENOUS

## 2017-11-19 MED ORDER — SUGAMMADEX SODIUM 200 MG/2ML IV SOLN
INTRAVENOUS | Status: AC
  Filled 2017-11-19: qty 2

## 2017-11-19 MED ORDER — ONABOTULINUMTOXINA 100 UNITS IJ SOLR
INTRAMUSCULAR | Status: DC | PRN
  Administered 2017-11-19: 100 [IU] via INTRAMUSCULAR

## 2017-11-19 MED ORDER — FENTANYL CITRATE (PF) 100 MCG/2ML IJ SOLN
INTRAMUSCULAR | Status: DC | PRN
  Administered 2017-11-19 (×2): 25 ug via INTRAVENOUS

## 2017-11-19 SURGICAL SUPPLY — 17 items
BAG DRAIN URO-CYSTO SKYTR STRL (DRAIN) ×3 IMPLANT
BAG DRN UROCATH (DRAIN) ×1
CATH ROBINSON RED A/P 14FR (CATHETERS) ×3 IMPLANT
CLOTH BEACON ORANGE TIMEOUT ST (SAFETY) ×3 IMPLANT
GLOVE BIO SURGEON STRL SZ7.5 (GLOVE) ×3 IMPLANT
GOWN STRL REUS W/TWL XL LVL3 (GOWN DISPOSABLE) ×3 IMPLANT
KIT TURNOVER CYSTO (KITS) ×3 IMPLANT
MANIFOLD NEPTUNE II (INSTRUMENTS) ×2 IMPLANT
NDL ASPIRATION 22 (NEEDLE) IMPLANT
NDL SAFETY ECLIPSE 18X1.5 (NEEDLE) IMPLANT
NEEDLE ASPIRATION 22 (NEEDLE) ×3 IMPLANT
NEEDLE HYPO 18GX1.5 SHARP (NEEDLE) ×3
PACK CYSTO (CUSTOM PROCEDURE TRAY) ×3 IMPLANT
SYR 20CC LL (SYRINGE) ×2 IMPLANT
TUBE CONNECTING 12'X1/4 (SUCTIONS) ×1
TUBE CONNECTING 12X1/4 (SUCTIONS) ×1 IMPLANT
WATER STERILE IRR 3000ML UROMA (IV SOLUTION) ×3 IMPLANT

## 2017-11-19 NOTE — Discharge Instructions (Signed)
I have reviewed discharge instructions in detail with the patient. They will follow-up with me or their physician as scheduled. My nurse will also be calling the patients as per protocol.CYSTOSCOPY HOME CARE INSTRUCTIONS ° °Activity: °Rest for the remainder of the day.  Do not drive or operate equipment today.  You may resume normal activities in one to two days as instructed by your physician.  ° °Meals: °Drink plenty of liquids and eat light foods such as gelatin or soup this evening.  You may return to a normal meal plan tomorrow. ° °Return to Work: °You may return to work in one to two days or as instructed by your physician. ° °Special Instructions / Symptoms: °Call your physician if any of these symptoms occur: ° ° -persistent or heavy bleeding ° -bleeding which continues after first few urination ° -large blood clots that are difficult to pass ° -urine stream diminishes or stops completely ° -fever equal to or higher than 101 degrees Farenheit. ° -cloudy urine with a strong, foul odor ° -severe pain ° °Females should always wipe from front to back after elimination.  You may feel some burning pain when you urinate.  This should disappear with time.  Applying moist heat to the lower abdomen or a hot tub bath may help relieve the pain. \ ° ° °Post Anesthesia Home Care Instructions ° °Activity: °Get plenty of rest for the remainder of the day. A responsible individual must stay with you for 24 hours following the procedure.  °For the next 24 hours, DO NOT: °-Drive a car °-Operate machinery °-Drink alcoholic beverages °-Take any medication unless instructed by your physician °-Make any legal decisions or sign important papers. ° °Meals: °Start with liquid foods such as gelatin or soup. Progress to regular foods as tolerated. Avoid greasy, spicy, heavy foods. If nausea and/or vomiting occur, drink only clear liquids until the nausea and/or vomiting subsides. Call your physician if vomiting continues. ° °Special  Instructions/Symptoms: °Your throat may feel dry or sore from the anesthesia or the breathing tube placed in your throat during surgery. If this causes discomfort, gargle with warm salt water. The discomfort should disappear within 24 hours. ° ° °

## 2017-11-19 NOTE — Interval H&P Note (Signed)
History and Physical Interval Note:  11/19/2017 9:53 AM  Jasmine Estrada  has presented today for surgery, with the diagnosis of REFRACTORY URGENCY INCONTINENCE  The various methods of treatment have been discussed with the patient and family. After consideration of risks, benefits and other options for treatment, the patient has consented to  Procedure(s): CYSTOSCOPY BOTOX INJECTION (N/A) as a surgical intervention .  The patient's history has been reviewed, patient examined, no change in status, stable for surgery.  I have reviewed the patient's chart and labs.  Questions were answered to the patient's satisfaction.     Chanceler Pullin A

## 2017-11-19 NOTE — Anesthesia Preprocedure Evaluation (Addendum)
Anesthesia Evaluation  Patient identified by MRN, date of birth, ID band  Reviewed: Allergy & Precautions, NPO status , Patient's Chart, lab work & pertinent test results  History of Anesthesia Complications Negative for: history of anesthetic complications  Airway Mallampati: III  TM Distance: >3 FB Neck ROM: Full  Mouth opening: Limited Mouth Opening  Dental  (+) Dental Advisory Given, Upper Dentures, Lower Dentures   Pulmonary asthma (last used albuterol 4 days ago, denies wheezing today) , Current Smoker,    breath sounds clear to auscultation       Cardiovascular negative cardio ROS   Rhythm:Regular Rate:Normal     Neuro/Psych tremors CVA (residual left sided weakness) negative psych ROS   GI/Hepatic negative GI ROS, Neg liver ROS,   Endo/Other  negative endocrine ROS  Renal/GU negative Renal ROS   Urgency incontinence    Musculoskeletal  (+) Arthritis ,   Abdominal   Peds  Hematology negative hematology ROS (+)   Anesthesia Other Findings Day of surgery medications reviewed with the patient.  Reproductive/Obstetrics                            Anesthesia Physical Anesthesia Plan  ASA: II  Anesthesia Plan: General   Post-op Pain Management:    Induction: Intravenous  PONV Risk Score and Plan: 1 and Ondansetron, Dexamethasone and Midazolam  Airway Management Planned: LMA  Additional Equipment:   Intra-op Plan:   Post-operative Plan: Extubation in OR  Informed Consent: I have reviewed the patients History and Physical, chart, labs and discussed the procedure including the risks, benefits and alternatives for the proposed anesthesia with the patient or authorized representative who has indicated his/her understanding and acceptance.   Dental advisory given  Plan Discussed with:   Anesthesia Plan Comments:        Anesthesia Quick Evaluation

## 2017-11-19 NOTE — Anesthesia Postprocedure Evaluation (Signed)
Anesthesia Post Note  Patient: Jasmine Estrada  Procedure(s) Performed: CYSTOSCOPY BOTOX INJECTION (N/A )     Patient location during evaluation: PACU Anesthesia Type: General Level of consciousness: awake and alert Pain management: pain level controlled Vital Signs Assessment: post-procedure vital signs reviewed and stable Respiratory status: spontaneous breathing, nonlabored ventilation, respiratory function stable and patient connected to nasal cannula oxygen Cardiovascular status: blood pressure returned to baseline and stable Postop Assessment: no apparent nausea or vomiting Anesthetic complications: no    Last Vitals:  Vitals:   11/19/17 1115 11/19/17 1200  BP: 118/73 124/72  Pulse: 69 (!) 57  Resp: 17 14  Temp:  36.5 C  SpO2: 97% 100%    Last Pain:  Vitals:   11/19/17 1200  TempSrc:   PainSc: 0-No pain                 Kayann Maj L Aubery Date

## 2017-11-19 NOTE — Transfer of Care (Signed)
Immediate Anesthesia Transfer of Care Note  Patient: Jasmine Estrada  Procedure(s) Performed: CYSTOSCOPY BOTOX INJECTION (N/A )  Patient Location: PACU  Anesthesia Type:General  Level of Consciousness: awake, alert , oriented and patient cooperative  Airway & Oxygen Therapy: Patient Spontanous Breathing and Patient connected to nasal cannula oxygen  Post-op Assessment: Report given to RN and Post -op Vital signs reviewed and stable  Post vital signs: Reviewed and stable  Last Vitals:  Vitals Value Taken Time  BP 112/85 11/19/2017 10:52 AM  Temp 36.6 C 11/19/2017 10:52 AM  Pulse 84 11/19/2017 10:53 AM  Resp 19 11/19/2017 10:53 AM  SpO2 98 % 11/19/2017 10:53 AM  Vitals shown include unvalidated device data.  Last Pain:  Vitals:   11/19/17 0801  TempSrc: Oral         Complications: No apparent anesthesia complications

## 2017-11-19 NOTE — Op Note (Signed)
Preoperative diagnosis: Refractory urgency incontinence Postoperative diagnosis: Refractory urgency incontinence Surgery: Injection of botulinum toxin 100 units and cystoscopy Surgeon: Dr. Nicki Reaper Mcdiarmid  The patient has the above diagnosis and consent of the above procedure.  Preoperative antibiotics were given.  She had mild distal meatal stenosis.  Obturator was utilized for injection scope.  Bladder mucosa and trigone were normal.  She had a little bit of a cystocele.  There is no cystitis  I injected at 5 and 7:00 and cephalad to the interureteric ridge with my usual template.  I injected 10 cc of normal saline with 100 units of Botox with approximately 0.5 cc/inj.  There was no bleeding.  Bladder was emptied and patient was sent to recovery

## 2017-11-19 NOTE — Anesthesia Procedure Notes (Signed)
Procedure Name: LMA Insertion Date/Time: 11/19/2017 10:24 AM Performed by: Raenette Rover, CRNA Pre-anesthesia Checklist: Patient identified, Emergency Drugs available, Suction available and Patient being monitored Patient Re-evaluated:Patient Re-evaluated prior to induction Oxygen Delivery Method: Circle system utilized Preoxygenation: Pre-oxygenation with 100% oxygen Induction Type: IV induction LMA: LMA inserted LMA Size: 4.0 Number of attempts: 1 Placement Confirmation: positive ETCO2,  CO2 detector and breath sounds checked- equal and bilateral Tube secured with: Tape Dental Injury: Teeth and Oropharynx as per pre-operative assessment

## 2017-11-20 ENCOUNTER — Encounter (HOSPITAL_BASED_OUTPATIENT_CLINIC_OR_DEPARTMENT_OTHER): Payer: Self-pay | Admitting: Urology

## 2017-11-21 ENCOUNTER — Encounter: Payer: Self-pay | Admitting: Podiatry

## 2017-11-21 ENCOUNTER — Ambulatory Visit (INDEPENDENT_AMBULATORY_CARE_PROVIDER_SITE_OTHER): Payer: Medicare HMO | Admitting: Podiatry

## 2017-11-21 DIAGNOSIS — M79671 Pain in right foot: Secondary | ICD-10-CM

## 2017-11-21 DIAGNOSIS — L600 Ingrowing nail: Secondary | ICD-10-CM | POA: Diagnosis not present

## 2017-11-21 DIAGNOSIS — M79672 Pain in left foot: Secondary | ICD-10-CM

## 2017-11-21 DIAGNOSIS — B351 Tinea unguium: Secondary | ICD-10-CM | POA: Diagnosis not present

## 2017-11-21 NOTE — Progress Notes (Signed)
Subjective: 62 y.o. year old female patient presents complaining of painful nails. Patient requests toe nails trimmed. Just had a bladder surgery and doing well. Ambulation assisted by using a cane.  Objective: Dermatologic: Thick yellow deformed nails x 10. Noted of ingrown right hallucal nail without infection. Vascular: Pedal pulses are all palpable. Orthopedic: Contracted lesser digits bilateral. Neurologic: All epicritic and tactile sensations grossly intact.  Assessment: Dystrophic mycotic nails x 10. Ingrown nail right great toe. Painful feet.  Treatment: All mycotic nails debrided.  Return in 3 months or as needed.

## 2017-11-21 NOTE — Patient Instructions (Signed)
Seen for hypertrophic nails and painful lesion left heel. All nails and painful lesion debrided. Return in 3 months or as needed.

## 2018-01-07 ENCOUNTER — Other Ambulatory Visit: Payer: Self-pay | Admitting: Internal Medicine

## 2018-01-07 DIAGNOSIS — Z1231 Encounter for screening mammogram for malignant neoplasm of breast: Secondary | ICD-10-CM

## 2018-02-14 ENCOUNTER — Ambulatory Visit
Admission: RE | Admit: 2018-02-14 | Discharge: 2018-02-14 | Disposition: A | Discharge status: Home / Self Care | Payer: Medicare HMO | Source: Ambulatory Visit | Attending: Internal Medicine | Admitting: Internal Medicine

## 2018-02-14 DIAGNOSIS — Z1231 Encounter for screening mammogram for malignant neoplasm of breast: Secondary | ICD-10-CM

## 2018-02-24 ENCOUNTER — Ambulatory Visit: Payer: Medicare HMO | Admitting: Podiatry

## 2018-04-16 HISTORY — PX: CATARACT EXTRACTION: SUR2

## 2018-06-11 ENCOUNTER — Ambulatory Visit (HOSPITAL_COMMUNITY)
Admission: EM | Admit: 2018-06-11 | Discharge: 2018-06-11 | Disposition: A | Discharge status: Home / Self Care | Payer: Medicare HMO

## 2018-06-11 NOTE — ED Notes (Signed)
Pt states she has already tested positive for trichomonas and has medicine in her vehicle.  Pt is only here to prove that her partner gave her the disease.  He is in the lobby.  Pt does not need to be seen today, but was unable to dismiss her from the system.

## 2018-06-16 ENCOUNTER — Telehealth (HOSPITAL_COMMUNITY): Payer: Self-pay | Admitting: Psychiatry

## 2018-06-22 ENCOUNTER — Encounter (HOSPITAL_COMMUNITY): Payer: Self-pay

## 2018-06-22 ENCOUNTER — Ambulatory Visit (HOSPITAL_COMMUNITY)
Admission: EM | Admit: 2018-06-22 | Discharge: 2018-06-22 | Disposition: A | Discharge status: Home / Self Care | Payer: Medicare HMO | Attending: Family Medicine | Admitting: Family Medicine

## 2018-06-22 DIAGNOSIS — N760 Acute vaginitis: Secondary | ICD-10-CM | POA: Diagnosis not present

## 2018-06-22 LAB — POCT URINALYSIS DIP (DEVICE)
Glucose, UA: NEGATIVE mg/dL
Nitrite: NEGATIVE
Protein, ur: 30 mg/dL — AB
Specific Gravity, Urine: >=1.03 (ref 1.005–1.030)
Urobilinogen, UA: 0.2 mg/dL (ref 0.0–1.0)
pH: 5.5 (ref 5.0–8.0)

## 2018-06-22 MED ORDER — METRONIDAZOLE 500 MG PO TABS: 500.0000 mg | ORAL_TABLET | Freq: Two times a day (BID) | ORAL | 0 refills | Status: AC

## 2018-06-22 NOTE — ED Triage Notes (Signed)
Pt presents with vaginal discomfort and burning with urination for over a week; pt & partner were recently treated for trich.

## 2018-06-22 NOTE — Discharge Instructions (Signed)
We will do a 7 day course of treatment as we wait for results of your urine culture and vaginal sample.  Will notify you of any positive findings and if any changes to treatment are needed.   Don't drink alcohol while taking.  Please withhold from intercourse for the next week. Please use condoms to prevent STD's.

## 2018-06-23 NOTE — ED Provider Notes (Signed)
[facility]    CSN: [id number] Arrival date & time: 06/22/18  1202     History   Chief Complaint Chief Complaint  Patient presents with  . Vaginitis    HPI Jasmine Estrada is a 63 y.o. female.   Jasmine Estrada presents with complaints of pain with urination, some frequency, as well as vaginal itching and burning. States feels similar to symptoms when tested positive for trichomonas three weeks ago. She took medications at time of diagnosis and her partner was treated. She states they had unprotected sex only 4 days after her partner was treated however, and now feels she has symptoms again. She feels the pain with urination more to external vulva. She does have a history of UTI's and urological surgery in the past. No blood in urine. No vaginal bleeding. Denies any obvious vaginal discharge. Denies any fevers, back or abdominal pain. Hx of asthma, arthritis, stroke.     ROS per HPI, negative if not otherwise mentioned.      Past Medical History:  Diagnosis Date  . Arthritis    legs  . Asthma    prn inhaler  . Cough 07/15/2012  . Essential and other specified forms of tremor 01/26/2013  . Full dentures   . Mass of knee 07/2012   left  . Memory difficulty 01/26/2013  . Obesity   . Overactive bladder 2019  . Stroke [facility])    right sided-weakness  . Tremor    right foot  . Urinary frequency     Patient Active Problem List   Diagnosis Date Noted  . Ankle edema 04/12/2015  . Status post right foot surgery 03/02/2015  . Equinus deformity of foot, acquired 02/02/2015  . Tenosynovitis of ankle 01/03/2015  . Posterior tibial tendon dysfunction 01/03/2015  . Onychomycosis 01/03/2015  . Lumbago 08/12/2013  . Pain in limb 05/25/2013  . Essential and other specified forms of tremor 01/26/2013  . Memory difficulty 01/26/2013    Past Surgical History:  Procedure Laterality Date  . ACHILLES TENDON LENGTHENING Right 02/25/2015  . BOTOX INJECTION N/A 11/19/2017   Procedure:  CYSTOSCOPY BOTOX INJECTION;  Surgeon: Bjorn Loser, MD;  Location: [facility];  Service: Urology;  Laterality: N/A;  . MASS EXCISION Left 07/21/2012   Procedure: EXCISION OF LARGE MASS LEFT KNEE WITH LIPOSUCTION ASSISTED;  Surgeon: Cristine Polio, MD;  Location: [place];  Service: Plastics;  Laterality: Left;  . TUBAL LIGATION  1982    OB History   No obstetric history on file.      Home Medications    Prior to Admission medications   Medication Sig Start Date End Date Taking? Authorizing Provider  acetaminophen-codeine (TYLENOL #2) 300-15 MG tablet Take 1 tablet by mouth every 4 (four) hours as needed for moderate pain.    Emergency, Nurse, RN  albuterol (PROVENTIL HFA;VENTOLIN HFA) 108 (90 BASE) MCG/ACT inhaler Inhale 2 puffs into the lungs every 6 (six) hours as needed for wheezing.    [provider]  aspirin 325 MG tablet Take 325 mg by mouth daily.    Emergency, Nurse, RN  beclomethasone (QVAR) 80 MCG/ACT inhaler Inhale into the lungs.    [provider]  metroNIDAZOLE (FLAGYL) 500 MG tablet Take 1 tablet (500 mg total) by mouth 2 (two) times daily for 7 days. 06/22/18 06/29/18  Zigmund Gottron, NP    Family History Family History  Problem Relation Age of Onset  . Cancer Sister   . Diabetes Sister   .  Mental illness Brother   . Alzheimer's disease Maternal Aunt   . Alzheimer's disease Maternal Grandmother   . Mental illness Sister     Social History Social History   Tobacco Use  . Smoking status: Current Every Day Smoker    Packs/day: 0.50    Years: 44.00    Pack years: 22.00    Types: Cigarettes  . Smokeless tobacco: Never Used  . Tobacco comment: 1 pack every 4 days  Substance Use Topics  . Alcohol use: No    Alcohol/week: 0.0 standard drinks  . Drug use: No     Allergies   Patient has no known allergies.   Review of Systems Review of Systems   Physical Exam Triage Vital Signs ED Triage Vitals  [06/22/18 1326]  Enc Vitals Group     BP 129/68     Pulse Rate 74     Resp 18     Temp 98.5 F (36.9 C)     Temp Source Oral     SpO2 100 %     Weight      Height      Head Circumference      Peak Flow      Pain Score 4     Pain Loc      Pain Edu?      Excl. in [place]?    No data found.  Updated Vital Signs BP 129/68 (BP Location: Right Arm)   Pulse 74   Temp 98.5 F (36.9 C) (Oral)   Resp 18   SpO2 100%    Physical Exam Constitutional:      General: She is not in acute distress.    Appearance: She is well-developed.  Cardiovascular:     Rate and Rhythm: Normal rate and regular rhythm.     Heart sounds: Normal heart sounds.  Pulmonary:     Effort: Pulmonary effort is normal.     Breath sounds: Normal breath sounds.  Abdominal:     Palpations: Abdomen is soft. Abdomen is not rigid.     Tenderness: There is no abdominal tenderness. There is no guarding or rebound.  Genitourinary:    Pubic Area: No rash.      Labia:        Right: No rash, tenderness or lesion.        Left: No rash, tenderness or lesion.      Vagina: Normal.     Cervix: Normal.     Comments: No significant discharge visualized; non tender; no redness, sores or lesions present Lymphadenopathy:     Lower Body: No right inguinal adenopathy. No left inguinal adenopathy.  Skin:    General: Skin is warm and dry.  Neurological:     Mental Status: She is alert and oriented to person, place, and time.      UC Treatments / Results  Labs (all labs ordered are listed, but only abnormal results are displayed) Labs Reviewed  URINE CULTURE - Abnormal; Notable for the following components:      Result Value   Culture >=100,000 COLONIES/mL GRAM NEGATIVE RODS (*)    All other components within normal limits  POCT URINALYSIS DIP (DEVICE) - Abnormal; Notable for the following components:   Bilirubin Urine SMALL (*)    Ketones, ur TRACE (*)    Hgb urine dipstick MODERATE (*)    Protein, ur 30 (*)     Leukocytes,Ua SMALL (*)    All other components within normal limits  CERVICOVAGINAL ANCILLARY  ONLY    EKG None  Radiology No results found.  Procedures Procedures (including critical care time)  Medications Ordered in UC Medications - No data to display  Initial Impression / Assessment and Plan / UC Course  I have reviewed the triage vital signs and the nursing notes.  Pertinent labs & imaging results that were available during my care of the patient were reviewed by me and considered in my medical decision making (see chart for details).     Urinary vs vaginal source of symptoms with inconclusive urine dip, small leuks and moderate HGB, with history inconsistent. Vaginal exam without obvious findings, vaginal cytology pending. Will notify of any positive findings and if any changes to treatment are needed. Flagyl provided empirically.  Patient verbalized understanding and agreeable to plan.   Final Clinical Impressions(s) / UC Diagnoses   Final diagnoses:  Acute vaginitis     Discharge Instructions     We will do a 7 day course of treatment as we wait for results of your urine culture and vaginal sample.  Will notify you of any positive findings and if any changes to treatment are needed.   Don't drink alcohol while taking.  Please withhold from intercourse for the next week. Please use condoms to prevent STD's.     ED Prescriptions    Medication Sig Dispense Auth. Provider   metroNIDAZOLE (FLAGYL) 500 MG tablet Take 1 tablet (500 mg total) by mouth 2 (two) times daily for 7 days. 14 tablet Zigmund Gottron, NP     Controlled Substance Prescriptions [place] Controlled Substance Registry consulted? Not Applicable   Zigmund Gottron, NP 06/23/18 1218

## 2018-06-24 ENCOUNTER — Telehealth (HOSPITAL_COMMUNITY): Payer: Self-pay | Admitting: Emergency Medicine

## 2018-06-24 LAB — CERVICOVAGINAL ANCILLARY ONLY
Bacterial vaginitis: NEGATIVE
Candida vaginitis: POSITIVE — AB
Chlamydia: NEGATIVE
Neisseria Gonorrhea: NEGATIVE
Trichomonas: NEGATIVE

## 2018-06-24 LAB — URINE CULTURE: Culture: >=100000 — AB

## 2018-06-24 MED ORDER — FLUCONAZOLE 150 MG PO TABS: 150.0000 mg | ORAL_TABLET | Freq: Once | ORAL | 0 refills | Status: AC

## 2018-06-24 MED ORDER — CEPHALEXIN 500 MG PO CAPS: 500.0000 mg | ORAL_CAPSULE | Freq: Two times a day (BID) | ORAL | 0 refills | Status: AC

## 2018-06-24 NOTE — Telephone Encounter (Signed)
Pt initially concerned for trich, on flagyl, trich negative. Called patient, states shes still having burning with urination. Will send in keflex for uti and diflucan for yeast. All questions answered.

## 2019-01-06 ENCOUNTER — Other Ambulatory Visit: Payer: Self-pay | Admitting: Family Medicine

## 2019-01-06 ENCOUNTER — Other Ambulatory Visit: Payer: Self-pay | Admitting: Family

## 2019-01-06 DIAGNOSIS — Z1231 Encounter for screening mammogram for malignant neoplasm of breast: Secondary | ICD-10-CM

## 2019-02-20 ENCOUNTER — Ambulatory Visit
Admission: RE | Admit: 2019-02-20 | Discharge: 2019-02-20 | Disposition: A | Discharge status: Home / Self Care | Payer: Medicare HMO | Source: Ambulatory Visit | Attending: Family | Admitting: Family

## 2019-02-20 ENCOUNTER — Other Ambulatory Visit: Payer: Self-pay

## 2019-02-20 DIAGNOSIS — Z1231 Encounter for screening mammogram for malignant neoplasm of breast: Secondary | ICD-10-CM

## 2019-04-28 ENCOUNTER — Encounter: Payer: Self-pay | Admitting: Sports Medicine

## 2019-04-28 ENCOUNTER — Ambulatory Visit (INDEPENDENT_AMBULATORY_CARE_PROVIDER_SITE_OTHER): Payer: Medicare HMO

## 2019-04-28 ENCOUNTER — Other Ambulatory Visit: Payer: Self-pay

## 2019-04-28 ENCOUNTER — Other Ambulatory Visit: Payer: Self-pay | Admitting: Sports Medicine

## 2019-04-28 ENCOUNTER — Ambulatory Visit (INDEPENDENT_AMBULATORY_CARE_PROVIDER_SITE_OTHER): Payer: Medicare HMO | Admitting: Sports Medicine

## 2019-04-28 DIAGNOSIS — B351 Tinea unguium: Secondary | ICD-10-CM

## 2019-04-28 DIAGNOSIS — M2141 Flat foot [pes planus] (acquired), right foot: Secondary | ICD-10-CM

## 2019-04-28 DIAGNOSIS — M2142 Flat foot [pes planus] (acquired), left foot: Secondary | ICD-10-CM

## 2019-04-28 DIAGNOSIS — M779 Enthesopathy, unspecified: Secondary | ICD-10-CM

## 2019-04-28 DIAGNOSIS — G2581 Restless legs syndrome: Secondary | ICD-10-CM

## 2019-04-28 DIAGNOSIS — M76829 Posterior tibial tendinitis, unspecified leg: Secondary | ICD-10-CM

## 2019-04-28 DIAGNOSIS — L603 Nail dystrophy: Secondary | ICD-10-CM

## 2019-04-28 DIAGNOSIS — M778 Other enthesopathies, not elsewhere classified: Secondary | ICD-10-CM | POA: Diagnosis not present

## 2019-04-28 DIAGNOSIS — M79671 Pain in right foot: Secondary | ICD-10-CM

## 2019-04-28 DIAGNOSIS — R251 Tremor, unspecified: Secondary | ICD-10-CM

## 2019-04-28 MED ORDER — ROPINIROLE HCL 5 MG PO TABS: 5.0000 mg | ORAL_TABLET | Freq: Every day | ORAL | 0 refills | Status: DC

## 2019-04-28 NOTE — Progress Notes (Signed)
Subjective: Jasmine Estrada is a 64 y.o. female patient seen today in office with complaint of mildly painful thickened and discolored nails. Patient is desiring treatment for nail changes; has tried OTC topicals/Medication in the past with no improvement. Reports that nails are becoming difficult to manage because of the thickness. Patient also reports that she has tremors and uncontrollable motion of her legs at bedtime. Admits to history of left knee, back pain, and history of stroke 7 years ago. Patient has no other pedal complaints at this time.   Review of Systems  All other systems reviewed and are negative.    Patient Active Problem List   Diagnosis Date Noted  . Ankle edema 04/12/2015  . Status post right foot surgery 03/02/2015  . Equinus deformity of foot, acquired 02/02/2015  . Tenosynovitis of ankle 01/03/2015  . Posterior tibial tendon dysfunction 01/03/2015  . Onychomycosis 01/03/2015  . Lumbago 08/12/2013  . Pain in limb 05/25/2013  . Essential and other specified forms of tremor 01/26/2013  . Memory difficulty 01/26/2013    Current Outpatient Medications on File Prior to Visit  Medication Sig Dispense Refill  . acetaminophen-codeine (TYLENOL #2) 300-15 MG tablet Take 1 tablet by mouth every 4 (four) hours as needed for moderate pain.    Marland Kitchen albuterol (PROVENTIL HFA;VENTOLIN HFA) 108 (90 BASE) MCG/ACT inhaler Inhale 2 puffs into the lungs every 6 (six) hours as needed for wheezing.    Marland Kitchen aspirin 325 MG tablet Take 325 mg by mouth daily.    . beclomethasone (QVAR) 80 MCG/ACT inhaler Inhale into the lungs.     No current facility-administered medications on file prior to visit.    No Known Allergies  Objective: Physical Exam  General: Well developed, nourished, no acute distress, awake, alert and oriented x 3  Vascular: Dorsalis pedis artery 2/4 bilateral, Posterior tibial artery 1/4 bilateral, skin temperature warm to warm proximal to distal bilateral lower  extremities, no varicosities, pedal hair present bilateral.  Neurological: Gross sensation present via light touch bilateral. Uncontrolled twitches of all toes.   Dermatological: Skin is warm, dry, and supple bilateral, Nails 1-10 are tender, short thick, and discolored with mild subungal debris, no webspace macerations present bilateral, no open lesions present bilateral, no callus/corns/hyperkeratotic tissue present bilateral. No signs of infection bilateral.  Musculoskeletal: + Pes planus and hammertoe and bunion boney deformities noted bilateral. Muscular strength 4/5 without painon range of motion. No pain with calf compression bilateral.  Assessment and Plan:  Problem List Items Addressed This Visit      Musculoskeletal and Integument   Posterior tibial tendon dysfunction   Onychomycosis    Other Visit Diagnoses    Bilateral foot pain    -  Primary   Relevant Orders   DG Foot Complete Right   DG Foot Complete Left   Tendonitis       Pes planus of both feet       Tremor       Relevant Medications   ropinirole (REQUIP) 5 MG tablet   Restless leg syndrome       Relevant Medications   ropinirole (REQUIP) 5 MG tablet      -Examined patient -Discussed treatment for tremors -Rx Requip -Advised patient if fails to improve may benefit from Neuro consult -Discussed treatment options for painful dystrophic nails  -Fungal culture was obtained by removing a portion of the hard nail itself from each of the involved toenails using a sterile nail nipper and sent to [facility] lab.  Patient tolerated the biopsy procedure well without discomfort or need for anesthesia.  -Patient to return in 4 weeks for follow up evaluation and discussion of fungal culture results or sooner if symptoms worsen.  Landis Martins, DPM

## 2019-04-28 NOTE — Addendum Note (Signed)
Addended by: Balraj Brayfield on: 04/28/2019 10:08 AM   Modules accepted: Orders

## 2019-05-21 ENCOUNTER — Other Ambulatory Visit: Payer: Self-pay | Admitting: Sports Medicine

## 2019-05-21 DIAGNOSIS — R251 Tremor, unspecified: Secondary | ICD-10-CM

## 2019-05-21 DIAGNOSIS — G2581 Restless legs syndrome: Secondary | ICD-10-CM

## 2019-05-21 NOTE — Telephone Encounter (Signed)
Dr. Stover please advice 

## 2019-05-26 ENCOUNTER — Other Ambulatory Visit: Payer: Self-pay

## 2019-05-26 ENCOUNTER — Ambulatory Visit (INDEPENDENT_AMBULATORY_CARE_PROVIDER_SITE_OTHER): Payer: Medicare HMO | Admitting: Sports Medicine

## 2019-05-26 ENCOUNTER — Encounter: Payer: Self-pay | Admitting: Sports Medicine

## 2019-05-26 VITALS — Temp 96.4°F

## 2019-05-26 DIAGNOSIS — M79671 Pain in right foot: Secondary | ICD-10-CM | POA: Diagnosis not present

## 2019-05-26 DIAGNOSIS — B351 Tinea unguium: Secondary | ICD-10-CM | POA: Diagnosis not present

## 2019-05-26 DIAGNOSIS — M79672 Pain in left foot: Secondary | ICD-10-CM | POA: Diagnosis not present

## 2019-05-26 DIAGNOSIS — R251 Tremor, unspecified: Secondary | ICD-10-CM | POA: Diagnosis not present

## 2019-05-26 NOTE — Progress Notes (Signed)
Subjective: Jasmine Estrada is a 64 y.o. female patient seen today in office for fungal culture results. Reports that the Requip medication has helped the twitching in her toes some. Patient has no other pedal complaints at this time.   Patient Active Problem List   Diagnosis Date Noted  . Ankle edema 04/12/2015  . Status post right foot surgery 03/02/2015  . Equinus deformity of foot, acquired 02/02/2015  . Tenosynovitis of ankle 01/03/2015  . Posterior tibial tendon dysfunction 01/03/2015  . Onychomycosis 01/03/2015  . Lumbago 08/12/2013  . Pain in limb 05/25/2013  . Essential and other specified forms of tremor 01/26/2013  . Memory difficulty 01/26/2013    Current Outpatient Medications on File Prior to Visit  Medication Sig Dispense Refill  . acetaminophen-codeine (TYLENOL #2) 300-15 MG tablet Take 1 tablet by mouth every 4 (four) hours as needed for moderate pain.    Marland Kitchen albuterol (PROVENTIL HFA;VENTOLIN HFA) 108 (90 BASE) MCG/ACT inhaler Inhale 2 puffs into the lungs every 6 (six) hours as needed for wheezing.    Marland Kitchen aspirin 325 MG tablet Take 325 mg by mouth daily.    . beclomethasone (QVAR) 80 MCG/ACT inhaler Inhale into the lungs.    . ropinirole (REQUIP) 5 MG tablet TAKE 1 TABLET BY MOUTH EVERYDAY AT BEDTIME 30 tablet 0   No current facility-administered medications on file prior to visit.    No Known Allergies  Objective: Physical Exam  General: Well developed, nourished, no acute distress, awake, alert and oriented x 3  Vascular: Dorsalis pedis artery 2/4 bilateral, Posterior tibial artery 1/4 bilateral, skin temperature warm to warm proximal to distal bilateral lower extremities, no varicosities, pedal hair present bilateral.  Neurological: Gross sensation present via light touch bilateral. Uncontrolled twitches of all toes as previously noted.   Dermatological: Skin is warm, dry, and supple bilateral, Nails 1-10 are tender, short thick, and discolored with mild  subungal debris, no webspace macerations present bilateral, no open lesions present bilateral, no callus/corns/hyperkeratotic tissue present bilateral. No signs of infection bilateral.  Musculoskeletal: + Pes planus and hammertoe and bunion boney deformities noted bilateral. Muscular strength 4/5 without painon range of motion. No pain with calf compression bilateral.  Fungal culture + T. Rubrum   Assessment and Plan:  Problem List Items Addressed This Visit      Musculoskeletal and Integument   Onychomycosis - Primary    Other Visit Diagnoses    Tremor       Bilateral foot pain          -Examined patient -Discussed treatment options for painful mycotic nails and twitches -Patient desires to continue with Requip for twitches and wants at this time to hold off on Neuro referral -Patient opt for oral Lamisil with full understanding of medication risks; ordered LFTs for review if within normal limits will proceed with sending Rx to pharmacy for lamisil 250mg  PO daily. Anticipate 12 week course.  -Advised good hygiene habits -Patient to return in 6 weeks for follow up evaluation or sooner if symptoms worsen.  Landis Martins, DPM

## 2019-06-16 ENCOUNTER — Other Ambulatory Visit: Payer: Self-pay | Admitting: Sports Medicine

## 2019-06-16 DIAGNOSIS — G2581 Restless legs syndrome: Secondary | ICD-10-CM

## 2019-06-16 DIAGNOSIS — R251 Tremor, unspecified: Secondary | ICD-10-CM

## 2019-06-16 LAB — HEPATIC FUNCTION PANEL
AG Ratio: 1.4 (calc) (ref 1.0–2.5)
ALT: 13 U/L (ref 6–29)
AST: 15 U/L (ref 10–35)
Albumin: 3.9 g/dL (ref 3.6–5.1)
Alkaline phosphatase (APISO): 123 U/L (ref 37–153)
Bilirubin, Direct: 0.1 mg/dL (ref 0.0–0.2)
Globulin: 2.8 g/dL (calc) (ref 1.9–3.7)
Indirect Bilirubin: 0.3 mg/dL (calc) (ref 0.2–1.2)
Total Bilirubin: 0.4 mg/dL (ref 0.2–1.2)
Total Protein: 6.7 g/dL (ref 6.1–8.1)

## 2019-06-16 MED ORDER — TERBINAFINE HCL 250 MG PO TABS: 250.0000 mg | ORAL_TABLET | Freq: Every day | ORAL | 0 refills | Status: DC

## 2019-06-16 NOTE — Progress Notes (Signed)
LFTs normal sent lamisil to pharmacy

## 2019-06-17 ENCOUNTER — Telehealth: Payer: Self-pay

## 2019-06-17 NOTE — Telephone Encounter (Signed)
-----   Message from Landis Martins, [place] sent at 06/16/2019 12:25 PM EST ----- Blood work was normal. I sent Lamisil to the pharmacy for patient to pick up and start taking

## 2019-06-17 NOTE — Telephone Encounter (Signed)
LVM to pt stating normal lab results and to pick up Rx at her pharmacy

## 2019-07-07 ENCOUNTER — Encounter: Payer: Self-pay | Admitting: Sports Medicine

## 2019-07-07 ENCOUNTER — Ambulatory Visit (INDEPENDENT_AMBULATORY_CARE_PROVIDER_SITE_OTHER): Payer: Medicare HMO | Admitting: Sports Medicine

## 2019-07-07 ENCOUNTER — Other Ambulatory Visit: Payer: Self-pay

## 2019-07-07 VITALS — Temp 97.8°F

## 2019-07-07 DIAGNOSIS — M79671 Pain in right foot: Secondary | ICD-10-CM

## 2019-07-07 DIAGNOSIS — B351 Tinea unguium: Secondary | ICD-10-CM | POA: Diagnosis not present

## 2019-07-07 DIAGNOSIS — R251 Tremor, unspecified: Secondary | ICD-10-CM

## 2019-07-07 NOTE — Progress Notes (Signed)
Subjective: Jasmine Estrada is a 64 y.o. female patient seen today in office for follow up evaluation of nail fungus on Lamisil. Patient states that she is doing well with no adverse reaction, no major change but getting a little lighter. Patient reports that she still has some pain and twitching, but otherwise has no other pedal complaints at this time.   Patient Active Problem List   Diagnosis Date Noted  . Ankle edema 04/12/2015  . Status post right foot surgery 03/02/2015  . Equinus deformity of foot, acquired 02/02/2015  . Tenosynovitis of ankle 01/03/2015  . Posterior tibial tendon dysfunction 01/03/2015  . Onychomycosis 01/03/2015  . Lumbago 08/12/2013  . Pain in limb 05/25/2013  . Essential and other specified forms of tremor 01/26/2013  . Memory difficulty 01/26/2013    Current Outpatient Medications on File Prior to Visit  Medication Sig Dispense Refill  . albuterol (PROVENTIL HFA;VENTOLIN HFA) 108 (90 BASE) MCG/ACT inhaler Inhale 2 puffs into the lungs every 6 (six) hours as needed for wheezing.    Marland Kitchen aspirin 325 MG tablet Take 325 mg by mouth daily.    Marland Kitchen atorvastatin (LIPITOR) 40 MG tablet     . buPROPion (WELLBUTRIN XL) 150 MG 24 hr tablet Take 150 mg by mouth daily.    . busPIRone (BUSPAR) 10 MG tablet     . diclofenac Sodium (VOLTAREN) 1 % GEL     . DUREZOL 0.05 % EMUL     . FLOVENT HFA 220 MCG/ACT inhaler     . HYDROcodone-acetaminophen (NORCO/VICODIN) 5-325 MG tablet     . ketorolac (ACULAR) 0.5 % ophthalmic solution     . LOTEMAX SM 0.38 % GEL     . meloxicam (MOBIC) 7.5 MG tablet     . PAZEO 0.7 % SOLN     . primidone (MYSOLINE) 50 MG tablet     . ropinirole (REQUIP) 5 MG tablet TAKE 1 TABLET BY MOUTH EVERYDAY AT BEDTIME 30 tablet 0  . terbinafine (LAMISIL) 250 MG tablet Take 1 tablet (250 mg total) by mouth daily. 90 tablet 0  . TOVIAZ 8 MG TB24 tablet      No current facility-administered medications on file prior to visit.    No Known  Allergies  Objective: Physical Exam  General: Well developed, nourished, no acute distress, awake, alert and oriented x 3  Vascular: Dorsalis pedis artery 2/4 bilateral, Posterior tibial artery1/4 bilateral, skin temperature warm to warm proximal to distal bilateral lower extremities, no varicosities, pedal hair present bilateral.  Neurological: Gross sensation present via light touch bilateral.Uncontrolled twitches of all toes as previously noted.   Dermatological: Skin is warm, dry, and supple bilateral, Nails 1-10 are tender, short thick, and discolored with mild subungal debris, no webspace macerations present bilateral, no open lesions present bilateral, no callus/corns/hyperkeratotic tissue present bilateral. No signs of infection bilateral.  Musculoskeletal:+ Pes planus and hammertoe and bunionboney deformities noted bilateral. Muscular strength 4/5without painon range of motion. No pain with calf compression bilateral.  Assessment and Plan:  Problem List Items Addressed This Visit      Musculoskeletal and Integument   Onychomycosis - Primary    Other Visit Diagnoses    Tremor       Bilateral foot pain         -Examined patient -At no charge debrided nails x 10 using sterile nail nipper without incident  -Cont with Lamisil; a new set of LFTs were ordered; will call patient to stop medication if abnormal  -  Advised good hygiene habits and educated patient on proper foot care to prevent re-infection -Continue with Requip for tremor/twitches if fails to improve to consider neuro referral  -Patient to return in 9 weeks for follow up evaluation or sooner if symptoms worsen.  Landis Martins, DPM

## 2019-07-11 ENCOUNTER — Other Ambulatory Visit: Payer: Self-pay | Admitting: Sports Medicine

## 2019-07-11 DIAGNOSIS — G2581 Restless legs syndrome: Secondary | ICD-10-CM

## 2019-07-11 DIAGNOSIS — R251 Tremor, unspecified: Secondary | ICD-10-CM

## 2019-07-13 NOTE — Telephone Encounter (Signed)
Dr. Stover please advice 

## 2019-07-29 ENCOUNTER — Other Ambulatory Visit: Payer: Self-pay | Admitting: Family Medicine

## 2019-07-29 ENCOUNTER — Ambulatory Visit
Admission: RE | Admit: 2019-07-29 | Discharge: 2019-07-29 | Disposition: A | Discharge status: Home / Self Care | Payer: Medicare HMO | Source: Ambulatory Visit | Attending: Family Medicine | Admitting: Family Medicine

## 2019-07-29 DIAGNOSIS — M1990 Unspecified osteoarthritis, unspecified site: Secondary | ICD-10-CM

## 2019-07-29 DIAGNOSIS — M25561 Pain in right knee: Secondary | ICD-10-CM | POA: Insufficient documentation

## 2019-07-29 DIAGNOSIS — M25562 Pain in left knee: Secondary | ICD-10-CM | POA: Insufficient documentation

## 2019-08-05 ENCOUNTER — Other Ambulatory Visit: Payer: Self-pay | Admitting: Sports Medicine

## 2019-08-05 DIAGNOSIS — R251 Tremor, unspecified: Secondary | ICD-10-CM

## 2019-08-05 DIAGNOSIS — G2581 Restless legs syndrome: Secondary | ICD-10-CM

## 2019-08-06 NOTE — Telephone Encounter (Signed)
Dr. Stover please advice 

## 2019-08-29 ENCOUNTER — Other Ambulatory Visit: Payer: Self-pay | Admitting: Sports Medicine

## 2019-08-29 DIAGNOSIS — R251 Tremor, unspecified: Secondary | ICD-10-CM

## 2019-08-29 DIAGNOSIS — G2581 Restless legs syndrome: Secondary | ICD-10-CM

## 2019-09-08 ENCOUNTER — Ambulatory Visit (INDEPENDENT_AMBULATORY_CARE_PROVIDER_SITE_OTHER): Payer: Medicare HMO | Admitting: Sports Medicine

## 2019-09-08 ENCOUNTER — Encounter: Payer: Self-pay | Admitting: Sports Medicine

## 2019-09-08 ENCOUNTER — Other Ambulatory Visit: Payer: Self-pay

## 2019-09-08 DIAGNOSIS — B351 Tinea unguium: Secondary | ICD-10-CM | POA: Diagnosis not present

## 2019-09-08 DIAGNOSIS — L84 Corns and callosities: Secondary | ICD-10-CM | POA: Diagnosis not present

## 2019-09-08 DIAGNOSIS — R251 Tremor, unspecified: Secondary | ICD-10-CM | POA: Diagnosis not present

## 2019-09-08 DIAGNOSIS — M79671 Pain in right foot: Secondary | ICD-10-CM

## 2019-09-08 DIAGNOSIS — M79672 Pain in left foot: Secondary | ICD-10-CM

## 2019-09-08 NOTE — Progress Notes (Signed)
Subjective: Jasmine Estrada is a 64 y.o. female patient seen today in office for follow up evaluation of nail fungus on Lamisil. Patient states that she is doing well with no adverse reaction, no major change but getting a little lighter like before taking medication and has 1 more bottle not sure if she needs to take it. Patient reports that she still has some pain in her Right knee and will go for knee replacement next month. Patient reports that she is still on Requip for restless leg and twitching, but otherwise has no other pedal complaints at this time.   Patient Active Problem List   Diagnosis Date Noted  . Ankle edema 04/12/2015  . Status post right foot surgery 03/02/2015  . Equinus deformity of foot, acquired 02/02/2015  . Tenosynovitis of ankle 01/03/2015  . Posterior tibial tendon dysfunction 01/03/2015  . Onychomycosis 01/03/2015  . Urge urinary incontinence 11/11/2014  . Hematuria, microscopic 09/23/2014  . OAB (overactive bladder) 09/23/2014  . Lumbago 08/12/2013  . Pain in limb 05/25/2013  . Essential and other specified forms of tremor 01/26/2013  . Memory difficulty 01/26/2013    Current Outpatient Medications on File Prior to Visit  Medication Sig Dispense Refill  . albuterol (PROVENTIL HFA;VENTOLIN HFA) 108 (90 BASE) MCG/ACT inhaler Inhale 2 puffs into the lungs every 6 (six) hours as needed for wheezing.    Marland Kitchen aspirin 325 MG tablet Take 325 mg by mouth daily.    Marland Kitchen atorvastatin (LIPITOR) 40 MG tablet     . buPROPion (WELLBUTRIN XL) 150 MG 24 hr tablet Take 150 mg by mouth daily.    . busPIRone (BUSPAR) 10 MG tablet     . diclofenac Sodium (VOLTAREN) 1 % GEL     . DUREZOL 0.05 % EMUL     . FLOVENT HFA 220 MCG/ACT inhaler     . ketorolac (ACULAR) 0.5 % ophthalmic solution     . LOTEMAX SM 0.38 % GEL     . meloxicam (MOBIC) 7.5 MG tablet 15 mg. Will be increased next month (4/21) to 15 mg    . PAZEO 0.7 % SOLN     . primidone (MYSOLINE) 50 MG tablet     . ropinirole  (REQUIP) 5 MG tablet TAKE 1 TABLET BY MOUTH EVERYDAY AT BEDTIME 30 tablet 0  . SYMBICORT 160-4.5 MCG/ACT inhaler     . terbinafine (LAMISIL) 250 MG tablet Take 1 tablet (250 mg total) by mouth daily. 90 tablet 0  . TOVIAZ 8 MG TB24 tablet      No current facility-administered medications on file prior to visit.    No Known Allergies  Objective: Physical Exam  General: Well developed, nourished, no acute distress, awake, alert and oriented x 3  Vascular: Dorsalis pedis artery 2/4 bilateral, Posterior tibial artery1/4 bilateral, skin temperature warm to warm proximal to distal bilateral lower extremities, no varicosities, pedal hair present bilateral.  Neurological: Gross sensation present via light touch bilateral.Uncontrolled twitches of all toes as previously noted.   Dermatological: Skin is warm, dry, and supple bilateral, Nails 1-10 are tender, short thick, and discolored with mild subungal debris, no webspace macerations present bilateral, no open lesions present bilateral, minimal callus noted to left 1st toe. No signs of infection bilateral.  Musculoskeletal:+ Pes planus and hammertoe and bunionboney deformities noted bilateral. Muscular strength 4/5without painon range of motion. No pain with calf compression bilateral.  Assessment and Plan:  Problem List Items Addressed This Visit      Musculoskeletal and  Integument   Onychomycosis - Primary    Other Visit Diagnoses    Tremor       Bilateral foot pain       Callus of foot          -Examined patient -At no charge debrided nails x 10 using sterile nail nipper without incident and callus at left 1st toe using sterile 15 blade; gave sample of foot miracle cream -Cont with Lamisil until finished estimated around 09/16/19 for the medication to be completed; advised patient not to take more than 90 days worth -Advised good hygiene habits and educated patient on proper foot care to prevent re-infection -Continue with  Requip for tremor/twitches like before -Continue with ortho follow up for knee  -Patient to return in 12 weeks for final med check and nail trim or sooner if symptoms worsen.  Landis Martins, DPM

## 2019-09-17 NOTE — Patient Instructions (Addendum)
DUE TO COVID-19 ONLY ONE VISITOR IS ALLOWED TO COME WITH YOU AND STAY IN THE WAITING ROOM ONLY DURING PRE OP AND PROCEDURE DAY OF SURGERY. THE 2 VISITORS  MAY VISIT WITH YOU AFTER SURGERY IN YOUR PRIVATE ROOM DURING VISITING HOURS ONLY!  YOU NEED TO HAVE A COVID 19 TEST ON_6/7______ @_11 :10______, THIS TEST MUST BE DONE BEFORE SURGERY, COME  [address], [place] , [address].  ([place]) ONCE YOUR COVID TEST IS COMPLETED, PLEASE BEGIN THE QUARANTINE INSTRUCTIONS AS OUTLINED IN YOUR HANDOUT.                Jasmine Estrada    Your procedure is scheduled on: 09/24/19   Report to [facility] Main  Entrance   Report to admitting at  7:35 AM     Call this number if you have problems the morning of surgery [phone]    . BRUSH YOUR TEETH MORNING OF SURGERY AND RINSE YOUR MOUTH OUT, NO CHEWING GUM CANDY OR MINTS.   Do not eat food After Midnight.   YOU MAY HAVE CLEAR LIQUIDS FROM MIDNIGHT UNTIL 7:30 AM.   At 7:30 AM Please finish the prescribed Pre-Surgery Gatorade drink  . Nothing by mouth after you finish the Gatorade drink !   Take these medicines the morning of surgery with A SIP OF WATER: Buspirone, bupropion  Ditropan   use inhalers and bring them with you to the hospital                               You may not have any metal on your body including hair pins and              piercings  Do not wear jewelry, make-up, lotions, powders or perfumes, deodorant             Do not wear nail polish on your fingernails.             Do not shave  48 hours prior to surgery.     Do not bring valuables to the hospital. [place].  Contacts, dentures or bridgework may not be worn into surgery.      Patients discharged the day of surgery will not be allowed to drive home.  IF YOU ARE HAVING SURGERY AND GOING HOME THE SAME DAY, YOU MUST HAVE AN ADULT TO DRIVE YOU HOME AND BE WITH YOU FOR 24 HOURS.  YOU MAY  GO HOME BY TAXI OR UBER OR ORTHERWISE, BUT AN ADULT MUST ACCOMPANY YOU HOME AND STAY WITH YOU FOR 24 HOURS.  Name and phone number of your driver:  Special Instructions: N/A              Please read over the following fact sheets you were given: _____________________________________________________________________             [facility] - Preparing for Surgery Before surgery, you can play an important role.   Because skin is not sterile, your skin needs to be as free of germs as possible .  You can reduce the number of germs on your skin by washing with CHG (chlorahexidine gluconate) soap before surgery.   CHG is an antiseptic cleaner which kills germs and bonds with the skin to continue killing germs even after washing. Please DO NOT  use if you have an allergy to CHG or antibacterial soaps.   If your skin becomes reddened/irritated stop using the CHG and inform your nurse when you arrive at Short Stay. Do not shave (including legs and underarms) for at least 48 hours prior to the first CHG shower.   Please follow these instructions carefully:  1.  Shower with CHG Soap the night before surgery and the  morning of Surgery.  2.  If you choose to wash your hair, wash your hair first as usual with your  normal  shampoo.  3.  After you shampoo, rinse your hair and body thoroughly to remove the  shampoo.                                        4.  Use CHG as you would any other liquid soap.  You can apply chg directly  to the skin and wash                       Gently with a scrungie or clean washcloth.  5.  Apply the CHG Soap to your body ONLY FROM THE NECK DOWN.   Do not use on face/ open                           Wound or open sores. Avoid contact with eyes, ears mouth and genitals (private parts).                       Wash face,  Genitals (private parts) with your normal soap.             6.  Wash thoroughly, paying special attention to the area where your surgery  will be performed.  7.   Thoroughly rinse your body with warm water from the neck down.  8.  DO NOT shower/wash with your normal soap after using and rinsing off  the CHG Soap.             9.  Pat yourself dry with a clean towel.            10.  Wear clean pajamas.            11.  Place clean sheets on your bed the night of your first shower and do not  sleep with pets. Day of Surgery : Do not apply any lotions/deodorants the morning of surgery.  Please wear clean clothes to the hospital/surgery center.    Incentive Spirometer  An incentive spirometer is a tool that can help keep your lungs clear and active. This tool measures how well you are filling your lungs with each breath. Taking long deep breaths may help reverse or decrease the chance of developing breathing (pulmonary) problems (especially infection) following:  A long period of time when you are unable to move or be active. BEFORE THE PROCEDURE   If the spirometer includes an indicator to show your best effort, your nurse or respiratory therapist will set it to a desired goal.  If possible, sit up straight or lean slightly forward. Try not to slouch.  Hold the incentive spirometer in an upright position. INSTRUCTIONS FOR USE  1. Sit on the edge of your bed if possible, or sit up as far as you can in bed or on a chair.  2. Hold the incentive spirometer in an upright position. 3. Breathe out normally. 4. Place the mouthpiece in your mouth and seal your lips tightly around it. 5. Breathe in slowly and as deeply as possible, raising the piston or the ball toward the top of the column. 6. Hold your breath for 3-5 seconds or for as long as possible. Allow the piston or ball to fall to the bottom of the column. 7. Remove the mouthpiece from your mouth and breathe out normally. 8. Rest for a few seconds and repeat Steps 1 through 7 at least 10 times every 1-2 hours when you are awake. Take your time and take a few normal breaths between deep breaths. 9. The  spirometer may include an indicator to show your best effort. Use the indicator as a goal to work toward during each repetition. 10. After each set of 10 deep breaths, practice coughing to be sure your lungs are clear. If you have an incision (the cut made at the time of surgery), support your incision when coughing by placing a pillow or rolled up towels firmly against it. Once you are able to get out of bed, walk around indoors and cough well. You may stop using the incentive spirometer when instructed by your caregiver.  RISKS AND COMPLICATIONS  Take your time so you do not get dizzy or light-headed.  If you are in pain, you may need to take or ask for pain medication before doing incentive spirometry. It is harder to take a deep breath if you are having pain. AFTER USE  Rest and breathe slowly and easily.  It can be helpful to keep track of a log of your progress. Your caregiver can provide you with a simple table to help with this. If you are using the spirometer at home, follow these instructions: [place] IF:   You are having difficultly using the spirometer.  You have trouble using the spirometer as often as instructed.  Your pain medication is not giving enough relief while using the spirometer.  You develop fever of 100.5 F (38.1 C) or higher. SEEK IMMEDIATE MEDICAL CARE IF:   You cough up bloody sputum that had not been present before.  You develop fever of 102 F (38.9 C) or greater.  You develop worsening pain at or near the incision site. MAKE SURE YOU:   Understand these instructions.  Will watch your condition.  Will get help right away if you are not doing well or get worse. Document Released: 08/13/2006 Document Revised: 06/25/2011 Document Reviewed: 10/14/2006 Exit[employer] Patient Information 2014 [place].   ________________________________________________________________________   FAILURE TO FOLLOW THESE INSTRUCTIONS MAY RESULT IN THE  CANCELLATION OF YOUR SURGERY PATIENT SIGNATURE_________________________________  NURSE SIGNATURE__________________________________  ________________________________________________________________________

## 2019-09-18 ENCOUNTER — Encounter (HOSPITAL_COMMUNITY)
Admission: RE | Admit: 2019-09-18 | Discharge: 2019-09-18 | Disposition: A | Discharge status: Home / Self Care | Payer: Medicare HMO | Source: Ambulatory Visit | Attending: Orthopedic Surgery | Admitting: Orthopedic Surgery

## 2019-09-18 ENCOUNTER — Encounter (HOSPITAL_COMMUNITY): Payer: Self-pay

## 2019-09-18 ENCOUNTER — Other Ambulatory Visit: Payer: Self-pay

## 2019-09-18 DIAGNOSIS — Z01812 Encounter for preprocedural laboratory examination: Secondary | ICD-10-CM | POA: Insufficient documentation

## 2019-09-18 HISTORY — DX: Sleep apnea, unspecified: G47.30

## 2019-09-18 HISTORY — DX: Dyspnea, unspecified: R06.00

## 2019-09-18 LAB — CBC
HCT: 40.6 % (ref 36.0–46.0)
Hemoglobin: 13.3 g/dL (ref 12.0–15.0)
MCH: 31.9 pg (ref 26.0–34.0)
MCHC: 32.8 g/dL (ref 30.0–36.0)
MCV: 97.4 fL (ref 80.0–100.0)
Platelets: 195 10*3/uL (ref 150–400)
RBC: 4.17 MIL/uL (ref 3.87–5.11)
RDW: 13.7 % (ref 11.5–15.5)
WBC: 5.9 10*3/uL (ref 4.0–10.5)
nRBC: 0 % (ref 0.0–0.2)

## 2019-09-18 LAB — SURGICAL PCR SCREEN
MRSA, PCR: NEGATIVE
Staphylococcus aureus: NEGATIVE

## 2019-09-18 LAB — ABO/RH: ABO/RH(D): O POS

## 2019-09-18 NOTE — Progress Notes (Signed)
COVID Vaccine Completed:yes Date COVID Vaccine completed:08/20/19 COVID vaccine manufacturer:   Moderna   PCP - Novella Rob FNP Cardiologist - no  Chest x-ray - no EKG - no Stress Test - no ECHO - no Cardiac Cath - no  Sleep Study - Yes CPAP - yes  Fasting Blood Sugar - NA Checks Blood Sugar _____ times a day  Blood Thinner Instructions:NA Aspirin Instructions: Last Dose:  Anesthesia review:   Patient denies shortness of breath, fever, cough and chest pain at PAT appointment yes  Patient verbalized understanding of instructions that were given to them at the PAT appointment. Patient was also instructed that they will need to review over the PAT instructions again at home before surgery. Yes

## 2019-09-21 ENCOUNTER — Other Ambulatory Visit (HOSPITAL_COMMUNITY)
Admission: RE | Admit: 2019-09-21 | Discharge: 2019-09-21 | Disposition: A | Discharge status: Home / Self Care | Payer: Medicare HMO | Source: Ambulatory Visit | Attending: Orthopedic Surgery | Admitting: Orthopedic Surgery

## 2019-09-21 DIAGNOSIS — Z20822 Contact with and (suspected) exposure to covid-19: Secondary | ICD-10-CM | POA: Insufficient documentation

## 2019-09-21 DIAGNOSIS — Z01812 Encounter for preprocedural laboratory examination: Secondary | ICD-10-CM | POA: Diagnosis present

## 2019-09-21 LAB — SARS CORONAVIRUS 2 (TAT 6-24 HRS): SARS Coronavirus 2: NEGATIVE

## 2019-09-23 NOTE — Progress Notes (Signed)
Pt informed of surgery time change.

## 2019-09-23 NOTE — Anesthesia Preprocedure Evaluation (Addendum)
Anesthesia Evaluation  Patient identified by MRN, date of birth, ID band Patient awake    Reviewed: Allergy & Precautions, NPO status , Patient's Chart, lab work & pertinent test results  History of Anesthesia Complications (+) history of anesthetic complications  Airway Mallampati: II  TM Distance: >3 FB Neck ROM: Full    Dental  (+) Edentulous Upper, Edentulous Lower   Pulmonary sleep apnea and Continuous Positive Airway Pressure Ventilation , COPD,  COPD inhaler, Current Smoker and Patient abstained from smoking.,  09/21/2019 SARS coronavirus NEG   breath sounds clear to auscultation       Cardiovascular negative cardio ROS   Rhythm:Regular Rate:Normal     Neuro/Psych CVA (R sided weakness, walks with cane/walker), Residual Symptoms    GI/Hepatic negative GI ROS, Neg liver ROS,   Endo/Other  Morbid obesity  Renal/GU negative Renal ROS     Musculoskeletal  (+) Arthritis , Osteoarthritis,    Abdominal (+) + obese,   Peds  Hematology negative hematology ROS (+)   Anesthesia Other Findings   Reproductive/Obstetrics                            Anesthesia Physical Anesthesia Plan  ASA: III  Anesthesia Plan: Spinal   Post-op Pain Management:  Regional for Post-op pain   Induction:   PONV Risk Score and Plan: 1 and Ondansetron  Airway Management Planned: Natural Airway and Simple Face Mask  Additional Equipment: None  Intra-op Plan:   Post-operative Plan:   Informed Consent: I have reviewed the patients History and Physical, chart, labs and discussed the procedure including the risks, benefits and alternatives for the proposed anesthesia with the patient or authorized representative who has indicated his/her understanding and acceptance.       Plan Discussed with: CRNA and Surgeon  Anesthesia Plan Comments: (Plan routine monitors, SAB with adductor canal block for psot op  analgesia)       Anesthesia Quick Evaluation

## 2019-09-24 ENCOUNTER — Ambulatory Visit (HOSPITAL_COMMUNITY): Payer: Medicare HMO | Admitting: Certified Registered Nurse Anesthetist

## 2019-09-24 ENCOUNTER — Encounter (HOSPITAL_COMMUNITY)
Admission: RE | Disposition: A | Discharge status: Home / Self Care | Payer: Self-pay | Source: Ambulatory Visit | Attending: Orthopedic Surgery

## 2019-09-24 ENCOUNTER — Observation Stay (HOSPITAL_COMMUNITY)
Admission: RE | Admit: 2019-09-24 | Discharge: 2019-09-25 | Disposition: A | Discharge status: Home / Self Care | Payer: Medicare HMO | Source: Ambulatory Visit | Attending: Orthopedic Surgery | Admitting: Orthopedic Surgery

## 2019-09-24 ENCOUNTER — Encounter (HOSPITAL_COMMUNITY): Payer: Self-pay | Admitting: Orthopedic Surgery

## 2019-09-24 ENCOUNTER — Other Ambulatory Visit: Payer: Self-pay

## 2019-09-24 DIAGNOSIS — Z6837 Body mass index (BMI) 37.0-37.9, adult: Secondary | ICD-10-CM | POA: Insufficient documentation

## 2019-09-24 DIAGNOSIS — M1711 Unilateral primary osteoarthritis, right knee: Secondary | ICD-10-CM | POA: Diagnosis present

## 2019-09-24 DIAGNOSIS — G4730 Sleep apnea, unspecified: Secondary | ICD-10-CM | POA: Insufficient documentation

## 2019-09-24 DIAGNOSIS — Z8551 Personal history of malignant neoplasm of bladder: Secondary | ICD-10-CM | POA: Diagnosis not present

## 2019-09-24 DIAGNOSIS — J449 Chronic obstructive pulmonary disease, unspecified: Secondary | ICD-10-CM | POA: Diagnosis not present

## 2019-09-24 DIAGNOSIS — I69351 Hemiplegia and hemiparesis following cerebral infarction affecting right dominant side: Secondary | ICD-10-CM | POA: Insufficient documentation

## 2019-09-24 DIAGNOSIS — I252 Old myocardial infarction: Secondary | ICD-10-CM | POA: Insufficient documentation

## 2019-09-24 DIAGNOSIS — Z96651 Presence of right artificial knee joint: Secondary | ICD-10-CM

## 2019-09-24 DIAGNOSIS — E669 Obesity, unspecified: Secondary | ICD-10-CM | POA: Diagnosis present

## 2019-09-24 DIAGNOSIS — F17210 Nicotine dependence, cigarettes, uncomplicated: Secondary | ICD-10-CM | POA: Diagnosis not present

## 2019-09-24 HISTORY — PX: TOTAL KNEE ARTHROPLASTY: SHX125

## 2019-09-24 LAB — TYPE AND SCREEN
ABO/RH(D): O POS
Antibody Screen: NEGATIVE

## 2019-09-24 SURGERY — ARTHROPLASTY, KNEE, TOTAL
Anesthesia: General | Site: Knee | Laterality: Right

## 2019-09-24 MED ORDER — ORAL CARE MOUTH RINSE
15.0000 mL | Freq: Once | OROMUCOSAL | Status: AC
  Administered 2019-09-24: 15 mL via OROMUCOSAL

## 2019-09-24 MED ORDER — CEFAZOLIN SODIUM-DEXTROSE 2-4 GM/100ML-% IV SOLN
2.0000 g | INTRAVENOUS | Status: AC
  Administered 2019-09-24: 2 g via INTRAVENOUS
  Filled 2019-09-24: qty 100

## 2019-09-24 MED ORDER — TRANEXAMIC ACID-NACL 1000-0.7 MG/100ML-% IV SOLN
1000.0000 mg | Freq: Once | INTRAVENOUS | Status: AC
  Administered 2019-09-24: 1000 mg via INTRAVENOUS
  Filled 2019-09-24: qty 100

## 2019-09-24 MED ORDER — MIDAZOLAM HCL 2 MG/2ML IJ SOLN
INTRAMUSCULAR | Status: AC
  Filled 2019-09-24: qty 2

## 2019-09-24 MED ORDER — HYDROCODONE-ACETAMINOPHEN 5-325 MG PO TABS
1.0000 | ORAL_TABLET | ORAL | Status: DC | PRN
  Administered 2019-09-24 – 2019-09-25 (×3): 2 via ORAL
  Administered 2019-09-25: 1 via ORAL
  Filled 2019-09-24 (×2): qty 2
  Filled 2019-09-24: qty 1
  Filled 2019-09-24: qty 2

## 2019-09-24 MED ORDER — CELECOXIB 200 MG PO CAPS
200.0000 mg | ORAL_CAPSULE | Freq: Two times a day (BID) | ORAL | Status: DC
  Administered 2019-09-24 – 2019-09-25 (×3): 200 mg via ORAL
  Filled 2019-09-24 (×3): qty 1

## 2019-09-24 MED ORDER — ROTIGOTINE 1 MG/24HR TD PT24: 1.0000 | MEDICATED_PATCH | Freq: Every day | TRANSDERMAL | Status: DC

## 2019-09-24 MED ORDER — OXYCODONE HCL 5 MG/5ML PO SOLN: 5.0000 mg | Freq: Once | ORAL | Status: DC | PRN

## 2019-09-24 MED ORDER — PRIMIDONE 50 MG PO TABS
25.0000 mg | ORAL_TABLET | Freq: Every day | ORAL | Status: DC
  Filled 2019-09-24: qty 0.5

## 2019-09-24 MED ORDER — METHOCARBAMOL 500 MG PO TABS
500.0000 mg | ORAL_TABLET | Freq: Four times a day (QID) | ORAL | Status: DC | PRN
  Administered 2019-09-24 – 2019-09-25 (×4): 500 mg via ORAL
  Filled 2019-09-24 (×4): qty 1

## 2019-09-24 MED ORDER — SODIUM CHLORIDE (PF) 0.9 % IJ SOLN
INTRAMUSCULAR | Status: AC
  Filled 2019-09-24: qty 50

## 2019-09-24 MED ORDER — PHENYLEPHRINE HCL-NACL 10-0.9 MG/250ML-% IV SOLN
INTRAVENOUS | Status: DC | PRN
Start: 2019-09-24 — End: 2019-09-24
  Administered 2019-09-24: 25 ug/min via INTRAVENOUS

## 2019-09-24 MED ORDER — ALUM & MAG HYDROXIDE-SIMETH 200-200-20 MG/5ML PO SUSP: 15.0000 mL | ORAL | Status: DC | PRN

## 2019-09-24 MED ORDER — BUDESONIDE 0.5 MG/2ML IN SUSP
0.5000 mg | Freq: Two times a day (BID) | RESPIRATORY_TRACT | Status: DC
  Filled 2019-09-24: qty 2

## 2019-09-24 MED ORDER — MIDAZOLAM HCL 2 MG/2ML IJ SOLN: 0.5000 mg | Freq: Once | INTRAMUSCULAR | Status: DC | PRN

## 2019-09-24 MED ORDER — CEFAZOLIN SODIUM-DEXTROSE 2-4 GM/100ML-% IV SOLN
2.0000 g | Freq: Four times a day (QID) | INTRAVENOUS | Status: AC
  Administered 2019-09-24 (×2): 2 g via INTRAVENOUS
  Filled 2019-09-24 (×2): qty 100

## 2019-09-24 MED ORDER — MOMETASONE FURO-FORMOTEROL FUM 200-5 MCG/ACT IN AERO
2.0000 | INHALATION_SPRAY | Freq: Two times a day (BID) | RESPIRATORY_TRACT | Status: DC
  Administered 2019-09-24 – 2019-09-25 (×3): 2 via RESPIRATORY_TRACT
  Filled 2019-09-24: qty 8.8

## 2019-09-24 MED ORDER — POVIDONE-IODINE 10 % EX SWAB
2.0000 "application " | Freq: Once | CUTANEOUS | Status: AC
  Administered 2019-09-24: 2 via TOPICAL

## 2019-09-24 MED ORDER — BUPIVACAINE IN DEXTROSE 0.75-8.25 % IT SOLN
INTRATHECAL | Status: DC | PRN
  Administered 2019-09-24: 13.5 mg via INTRATHECAL

## 2019-09-24 MED ORDER — ALBUTEROL SULFATE (2.5 MG/3ML) 0.083% IN NEBU: 2.5000 mg | INHALATION_SOLUTION | Freq: Four times a day (QID) | RESPIRATORY_TRACT | Status: DC | PRN

## 2019-09-24 MED ORDER — MAGNESIUM CITRATE PO SOLN: 1.0000 | Freq: Once | ORAL | Status: DC | PRN

## 2019-09-24 MED ORDER — SODIUM CHLORIDE 0.9 % IV SOLN: INTRAVENOUS | Status: DC

## 2019-09-24 MED ORDER — MORPHINE SULFATE (PF) 2 MG/ML IV SOLN
0.5000 mg | INTRAVENOUS | Status: DC | PRN
  Administered 2019-09-24 – 2019-09-25 (×2): 1 mg via INTRAVENOUS
  Filled 2019-09-24 (×2): qty 1

## 2019-09-24 MED ORDER — METHOCARBAMOL 500 MG IVPB - SIMPLE MED
INTRAVENOUS | Status: AC
  Filled 2019-09-24: qty 50

## 2019-09-24 MED ORDER — DIPHENHYDRAMINE HCL 12.5 MG/5ML PO ELIX: 12.5000 mg | ORAL_SOLUTION | ORAL | Status: DC | PRN

## 2019-09-24 MED ORDER — LACTATED RINGERS IV SOLN: INTRAVENOUS | Status: DC

## 2019-09-24 MED ORDER — DEXAMETHASONE SODIUM PHOSPHATE 10 MG/ML IJ SOLN
INTRAMUSCULAR | Status: AC
  Filled 2019-09-24: qty 1

## 2019-09-24 MED ORDER — STERILE WATER FOR IRRIGATION IR SOLN
Status: DC | PRN
  Administered 2019-09-24: 2000 mL

## 2019-09-24 MED ORDER — PROPOFOL 10 MG/ML IV BOLUS
INTRAVENOUS | Status: DC | PRN
  Administered 2019-09-24: 75 ug/kg/min via INTRAVENOUS

## 2019-09-24 MED ORDER — DEXAMETHASONE SODIUM PHOSPHATE 10 MG/ML IJ SOLN
10.0000 mg | Freq: Once | INTRAMUSCULAR | Status: AC
  Administered 2019-09-24: 10 mg via INTRAVENOUS

## 2019-09-24 MED ORDER — HYDROMORPHONE HCL 1 MG/ML IJ SOLN
INTRAMUSCULAR | Status: AC
  Filled 2019-09-24: qty 1

## 2019-09-24 MED ORDER — ATORVASTATIN CALCIUM 40 MG PO TABS
40.0000 mg | ORAL_TABLET | Freq: Every day | ORAL | Status: DC
  Administered 2019-09-25: 40 mg via ORAL
  Filled 2019-09-24 (×2): qty 1

## 2019-09-24 MED ORDER — METOCLOPRAMIDE HCL 5 MG PO TABS: 5.0000 mg | ORAL_TABLET | Freq: Three times a day (TID) | ORAL | Status: DC | PRN

## 2019-09-24 MED ORDER — SODIUM CHLORIDE 0.9 % IR SOLN
Status: DC | PRN
  Administered 2019-09-24: 1000 mL

## 2019-09-24 MED ORDER — FENTANYL CITRATE (PF) 100 MCG/2ML IJ SOLN
INTRAMUSCULAR | Status: AC
  Filled 2019-09-24: qty 2

## 2019-09-24 MED ORDER — 0.9 % SODIUM CHLORIDE (POUR BTL) OPTIME
TOPICAL | Status: DC | PRN
  Administered 2019-09-24: 1000 mL

## 2019-09-24 MED ORDER — PHENOL 1.4 % MT LIQD: 1.0000 | OROMUCOSAL | Status: DC | PRN

## 2019-09-24 MED ORDER — BUPIVACAINE HCL (PF) 0.25 % IJ SOLN
INTRAMUSCULAR | Status: AC
  Filled 2019-09-24: qty 30

## 2019-09-24 MED ORDER — ROPIVACAINE HCL 7.5 MG/ML IJ SOLN
INTRAMUSCULAR | Status: DC | PRN
  Administered 2019-09-24: 20 mL via PERINEURAL

## 2019-09-24 MED ORDER — DEXAMETHASONE SODIUM PHOSPHATE 10 MG/ML IJ SOLN
10.0000 mg | Freq: Once | INTRAMUSCULAR | Status: AC
  Administered 2019-09-25: 10 mg via INTRAVENOUS
  Filled 2019-09-24: qty 1

## 2019-09-24 MED ORDER — FENTANYL CITRATE (PF) 100 MCG/2ML IJ SOLN
INTRAMUSCULAR | Status: DC | PRN
  Administered 2019-09-24: 25 ug via INTRAVENOUS
  Administered 2019-09-24 (×2): 50 ug via INTRAVENOUS
  Administered 2019-09-24: 25 ug via INTRAVENOUS
  Administered 2019-09-24: 50 ug via INTRAVENOUS

## 2019-09-24 MED ORDER — BUPIVACAINE HCL (PF) 0.25 % IJ SOLN
INTRAMUSCULAR | Status: DC | PRN
  Administered 2019-09-24: 30 mL

## 2019-09-24 MED ORDER — DOCUSATE SODIUM 100 MG PO CAPS
100.0000 mg | ORAL_CAPSULE | Freq: Two times a day (BID) | ORAL | Status: DC
  Administered 2019-09-24 – 2019-09-25 (×3): 100 mg via ORAL
  Filled 2019-09-24 (×2): qty 1

## 2019-09-24 MED ORDER — OXYBUTYNIN CHLORIDE 5 MG PO TABS
5.0000 mg | ORAL_TABLET | Freq: Two times a day (BID) | ORAL | Status: DC
  Administered 2019-09-24 – 2019-09-25 (×3): 5 mg via ORAL
  Filled 2019-09-24 (×3): qty 1

## 2019-09-24 MED ORDER — BUPROPION HCL ER (SR) 150 MG PO TB12
150.0000 mg | ORAL_TABLET | Freq: Two times a day (BID) | ORAL | Status: DC
  Administered 2019-09-24 – 2019-09-25 (×2): 150 mg via ORAL
  Filled 2019-09-24 (×3): qty 1

## 2019-09-24 MED ORDER — KETOROLAC TROMETHAMINE 30 MG/ML IJ SOLN
INTRAMUSCULAR | Status: DC | PRN
  Administered 2019-09-24: 30 mg via INTRAVENOUS

## 2019-09-24 MED ORDER — BISACODYL 10 MG RE SUPP: 10.0000 mg | Freq: Every day | RECTAL | Status: DC | PRN

## 2019-09-24 MED ORDER — METHOCARBAMOL 500 MG IVPB - SIMPLE MED
500.0000 mg | Freq: Four times a day (QID) | INTRAVENOUS | Status: DC | PRN
  Administered 2019-09-24: 500 mg via INTRAVENOUS
  Filled 2019-09-24: qty 50

## 2019-09-24 MED ORDER — BUSPIRONE HCL 5 MG PO TABS
10.0000 mg | ORAL_TABLET | Freq: Two times a day (BID) | ORAL | Status: DC
  Administered 2019-09-24 – 2019-09-25 (×3): 10 mg via ORAL
  Filled 2019-09-24 (×3): qty 2

## 2019-09-24 MED ORDER — ACETAMINOPHEN 325 MG PO TABS: 325.0000 mg | ORAL_TABLET | Freq: Four times a day (QID) | ORAL | Status: DC | PRN

## 2019-09-24 MED ORDER — KETOROLAC TROMETHAMINE 30 MG/ML IJ SOLN
INTRAMUSCULAR | Status: AC
  Filled 2019-09-24: qty 1

## 2019-09-24 MED ORDER — MIDAZOLAM HCL 5 MG/5ML IJ SOLN
INTRAMUSCULAR | Status: DC | PRN
  Administered 2019-09-24 (×2): 1 mg via INTRAVENOUS

## 2019-09-24 MED ORDER — PROMETHAZINE HCL 25 MG/ML IJ SOLN: 6.2500 mg | INTRAMUSCULAR | Status: DC | PRN

## 2019-09-24 MED ORDER — TERBINAFINE HCL 250 MG PO TABS
250.0000 mg | ORAL_TABLET | Freq: Every day | ORAL | Status: DC
  Filled 2019-09-24 (×2): qty 1

## 2019-09-24 MED ORDER — PROPOFOL 500 MG/50ML IV EMUL
INTRAVENOUS | Status: DC | PRN
  Administered 2019-09-24 (×2): 20 mg via INTRAVENOUS

## 2019-09-24 MED ORDER — ONDANSETRON HCL 4 MG/2ML IJ SOLN
INTRAMUSCULAR | Status: AC
  Filled 2019-09-24: qty 2

## 2019-09-24 MED ORDER — ONDANSETRON HCL 4 MG/2ML IJ SOLN
INTRAMUSCULAR | Status: DC | PRN
  Administered 2019-09-24: 4 mg via INTRAVENOUS

## 2019-09-24 MED ORDER — ACETAMINOPHEN 500 MG PO TABS
1000.0000 mg | ORAL_TABLET | Freq: Once | ORAL | Status: AC
  Administered 2019-09-24: 1000 mg via ORAL
  Filled 2019-09-24: qty 2

## 2019-09-24 MED ORDER — SODIUM CHLORIDE (PF) 0.9 % IJ SOLN
INTRAMUSCULAR | Status: DC | PRN
  Administered 2019-09-24: 30 mL via INTRAVENOUS

## 2019-09-24 MED ORDER — HYDROMORPHONE HCL 1 MG/ML IJ SOLN
0.2500 mg | INTRAMUSCULAR | Status: DC | PRN
  Administered 2019-09-24 (×2): 0.5 mg via INTRAVENOUS

## 2019-09-24 MED ORDER — OXYCODONE HCL 5 MG PO TABS: 5.0000 mg | ORAL_TABLET | Freq: Once | ORAL | Status: DC | PRN

## 2019-09-24 MED ORDER — MEPERIDINE HCL 50 MG/ML IJ SOLN: 6.2500 mg | INTRAMUSCULAR | Status: DC | PRN

## 2019-09-24 MED ORDER — ONDANSETRON HCL 4 MG/2ML IJ SOLN: 4.0000 mg | Freq: Four times a day (QID) | INTRAMUSCULAR | Status: DC | PRN

## 2019-09-24 MED ORDER — MENTHOL 3 MG MT LOZG: 1.0000 | LOZENGE | OROMUCOSAL | Status: DC | PRN

## 2019-09-24 MED ORDER — METOCLOPRAMIDE HCL 5 MG/ML IJ SOLN: 5.0000 mg | Freq: Three times a day (TID) | INTRAMUSCULAR | Status: DC | PRN

## 2019-09-24 MED ORDER — RIVAROXABAN 10 MG PO TABS
10.0000 mg | ORAL_TABLET | ORAL | Status: DC
  Administered 2019-09-25: 10 mg via ORAL
  Filled 2019-09-24: qty 1

## 2019-09-24 MED ORDER — ROPINIROLE HCL 1 MG PO TABS
5.0000 mg | ORAL_TABLET | Freq: Every day | ORAL | Status: DC
  Filled 2019-09-24: qty 5

## 2019-09-24 MED ORDER — ONDANSETRON HCL 4 MG PO TABS: 4.0000 mg | ORAL_TABLET | Freq: Four times a day (QID) | ORAL | Status: DC | PRN

## 2019-09-24 MED ORDER — POLYETHYLENE GLYCOL 3350 17 G PO PACK
17.0000 g | PACK | Freq: Two times a day (BID) | ORAL | Status: DC
  Administered 2019-09-24 (×2): 17 g via ORAL
  Filled 2019-09-24 (×3): qty 1

## 2019-09-24 MED ORDER — TRANEXAMIC ACID-NACL 1000-0.7 MG/100ML-% IV SOLN
1000.0000 mg | INTRAVENOUS | Status: AC
  Administered 2019-09-24: 1000 mg via INTRAVENOUS
  Filled 2019-09-24: qty 100

## 2019-09-24 MED ORDER — CHLORHEXIDINE GLUCONATE 0.12 % MT SOLN: 15.0000 mL | Freq: Once | OROMUCOSAL | Status: AC

## 2019-09-24 MED ORDER — FERROUS SULFATE 325 (65 FE) MG PO TABS
325.0000 mg | ORAL_TABLET | Freq: Two times a day (BID) | ORAL | Status: DC
  Administered 2019-09-24 – 2019-09-25 (×2): 325 mg via ORAL
  Filled 2019-09-24 (×2): qty 1

## 2019-09-24 MED ORDER — HYDROCODONE-ACETAMINOPHEN 7.5-325 MG PO TABS: 1.0000 | ORAL_TABLET | ORAL | Status: DC | PRN

## 2019-09-24 MED ORDER — PROPOFOL 1000 MG/100ML IV EMUL
INTRAVENOUS | Status: AC
  Filled 2019-09-24: qty 100

## 2019-09-24 MED ORDER — PHENYLEPHRINE HCL (PRESSORS) 10 MG/ML IV SOLN
INTRAVENOUS | Status: AC
  Filled 2019-09-24: qty 1

## 2019-09-24 SURGICAL SUPPLY — 66 items
ADH SKN CLS APL DERMABOND .7 (GAUZE/BANDAGES/DRESSINGS) ×1
ATTUNE MED ANAT PAT 38 KNEE (Knees) ×1 IMPLANT
ATTUNE PSFEM RTSZ5 NARCEM KNEE (Femur) ×1 IMPLANT
ATTUNE PSRP INSR SZ5 6 KNEE (Insert) ×1 IMPLANT
BAG SPEC THK2 15X12 ZIP CLS (MISCELLANEOUS)
BAG ZIPLOCK 12X15 (MISCELLANEOUS) IMPLANT
BASE TIBIAL ROT PLAT SZ 5 KNEE (Knees) IMPLANT
BLADE SAW SGTL 11.0X1.19X90.0M (BLADE) IMPLANT
BLADE SAW SGTL 13.0X1.19X90.0M (BLADE) ×2 IMPLANT
BLADE SURG SZ10 CARB STEEL (BLADE) ×4 IMPLANT
BNDG CMPR MED 10X6 ELC LF (GAUZE/BANDAGES/DRESSINGS) ×1
BNDG ELASTIC 6X10 VLCR STRL LF (GAUZE/BANDAGES/DRESSINGS) ×1 IMPLANT
BNDG ELASTIC 6X5.8 VLCR STR LF (GAUZE/BANDAGES/DRESSINGS) ×2 IMPLANT
BOWL SMART MIX CTS (DISPOSABLE) ×2 IMPLANT
BSPLAT TIB 5 CMNT ROT PLAT STR (Knees) ×1 IMPLANT
CEMENT HV SMART SET (Cement) ×2 IMPLANT
COVER SURGICAL LIGHT HANDLE (MISCELLANEOUS) ×2 IMPLANT
COVER WAND RF STERILE (DRAPES) IMPLANT
CUFF TOURN SGL QUICK 34 (TOURNIQUET CUFF) ×2
CUFF TRNQT CYL 34X4.125X (TOURNIQUET CUFF) ×1 IMPLANT
DECANTER SPIKE VIAL GLASS SM (MISCELLANEOUS) ×4 IMPLANT
DERMABOND ADVANCED (GAUZE/BANDAGES/DRESSINGS) ×1
DERMABOND ADVANCED .7 DNX12 (GAUZE/BANDAGES/DRESSINGS) ×1 IMPLANT
DRAPE U-SHAPE 47X51 STRL (DRAPES) ×2 IMPLANT
DRESSING AQUACEL AG SP 3.5X10 (GAUZE/BANDAGES/DRESSINGS) ×1 IMPLANT
DRSG AQUACEL AG ADV 3.5X10 (GAUZE/BANDAGES/DRESSINGS) ×1 IMPLANT
DRSG AQUACEL AG SP 3.5X10 (GAUZE/BANDAGES/DRESSINGS) ×2
DURAPREP 26ML APPLICATOR (WOUND CARE) ×4 IMPLANT
ELECT REM PT RETURN 15FT ADLT (MISCELLANEOUS) ×2 IMPLANT
GLOVE BIO SURGEON STRL SZ 6 (GLOVE) ×2 IMPLANT
GLOVE BIOGEL PI IND STRL 6.5 (GLOVE) ×1 IMPLANT
GLOVE BIOGEL PI IND STRL 7.5 (GLOVE) ×1 IMPLANT
GLOVE BIOGEL PI IND STRL 8.5 (GLOVE) ×1 IMPLANT
GLOVE BIOGEL PI INDICATOR 6.5 (GLOVE) ×1
GLOVE BIOGEL PI INDICATOR 7.5 (GLOVE) ×1
GLOVE BIOGEL PI INDICATOR 8.5 (GLOVE) ×1
GLOVE ECLIPSE 8.0 STRL XLNG CF (GLOVE) ×2 IMPLANT
GLOVE ORTHO TXT STRL SZ7.5 (GLOVE) ×2 IMPLANT
GOWN STRL REUS W/ TWL LRG LVL3 (GOWN DISPOSABLE) ×1 IMPLANT
GOWN STRL REUS W/TWL 2XL LVL3 (GOWN DISPOSABLE) ×2 IMPLANT
GOWN STRL REUS W/TWL LRG LVL3 (GOWN DISPOSABLE) ×4 IMPLANT
HANDPIECE INTERPULSE COAX TIP (DISPOSABLE) ×2
HOLDER FOLEY CATH W/STRAP (MISCELLANEOUS) ×1 IMPLANT
KIT TURNOVER KIT A (KITS) IMPLANT
MANIFOLD NEPTUNE II (INSTRUMENTS) ×2 IMPLANT
NDL SAFETY ECLIPSE 18X1.5 (NEEDLE) IMPLANT
NEEDLE HYPO 18GX1.5 SHARP (NEEDLE)
NS IRRIG 1000ML POUR BTL (IV SOLUTION) ×2 IMPLANT
PACK TOTAL KNEE CUSTOM (KITS) ×2 IMPLANT
PENCIL SMOKE EVACUATOR (MISCELLANEOUS) ×1 IMPLANT
PIN DRILL FIX HALF THREAD (BIT) ×1 IMPLANT
PIN FIX SIGMA LCS THRD HI (PIN) ×1 IMPLANT
PROTECTOR NERVE ULNAR (MISCELLANEOUS) ×2 IMPLANT
SET HNDPC FAN SPRY TIP SCT (DISPOSABLE) ×1 IMPLANT
SET PAD KNEE POSITIONER (MISCELLANEOUS) ×2 IMPLANT
SUT MNCRL AB 4-0 PS2 18 (SUTURE) ×2 IMPLANT
SUT STRATAFIX PDS+ 0 24IN (SUTURE) ×2 IMPLANT
SUT VIC AB 1 CT1 36 (SUTURE) ×2 IMPLANT
SUT VIC AB 2-0 CT1 27 (SUTURE) ×6
SUT VIC AB 2-0 CT1 TAPERPNT 27 (SUTURE) ×3 IMPLANT
SYR 3ML LL SCALE MARK (SYRINGE) ×2 IMPLANT
TIBIAL BASE ROT PLAT SZ 5 KNEE (Knees) ×2 IMPLANT
TRAY FOLEY MTR SLVR 16FR STAT (SET/KITS/TRAYS/PACK) ×2 IMPLANT
WATER STERILE IRR 1000ML POUR (IV SOLUTION) ×4 IMPLANT
WRAP KNEE MAXI GEL POST OP (GAUZE/BANDAGES/DRESSINGS) ×2 IMPLANT
YANKAUER SUCT BULB TIP 10FT TU (MISCELLANEOUS) ×2 IMPLANT

## 2019-09-24 NOTE — Discharge Instructions (Addendum)
INSTRUCTIONS AFTER JOINT REPLACEMENT   o Remove items at home which could result in a fall. This includes throw rugs or furniture in walking pathways o ICE to the affected joint every three hours while awake for 30 minutes at a time, for at least the first 3-5 days, and then as needed for pain and swelling.  Continue to use ice for pain and swelling. You may notice swelling that will progress down to the foot and ankle.  This is normal after surgery.  Elevate your leg when you are not up walking on it.   o Continue to use the breathing machine you got in the hospital (incentive spirometer) which will help keep your temperature down.  It is common for your temperature to cycle up and down following surgery, especially at night when you are not up moving around and exerting yourself.  The breathing machine keeps your lungs expanded and your temperature down.   DIET:  As you were doing prior to hospitalization, we recommend a well-balanced diet.  DRESSING / WOUND CARE / SHOWERING  Keep the surgical dressing until follow up.  The dressing is water proof, so you can shower without any extra covering.  IF THE DRESSING FALLS OFF or the wound gets wet inside, change the dressing with sterile gauze.  Please use good hand washing techniques before changing the dressing.  Do not use any lotions or creams on the incision until instructed by your surgeon.    ACTIVITY  o Increase activity slowly as tolerated, but follow the weight bearing instructions below.   o No driving for 6 weeks or until further direction given by your physician.  You cannot drive while taking narcotics.  o No lifting or carrying greater than 10 lbs. until further directed by your surgeon. o Avoid periods of inactivity such as sitting longer than an hour when not asleep. This helps prevent blood clots.  o You may return to work once you are authorized by your doctor.     WEIGHT BEARING   Weight bearing as tolerated with assist  device (walker, cane, etc) as directed, use it as long as suggested by your surgeon or therapist, typically at least 4-6 weeks.   EXERCISES  Results after joint replacement surgery are often greatly improved when you follow the exercise, range of motion and muscle strengthening exercises prescribed by your doctor. Safety measures are also important to protect the joint from further injury. Any time any of these exercises cause you to have increased pain or swelling, decrease what you are doing until you are comfortable again and then slowly increase them. If you have problems or questions, call your caregiver or physical therapist for advice.   Rehabilitation is important following a joint replacement. After just a few days of immobilization, the muscles of the leg can become weakened and shrink (atrophy).  These exercises are designed to build up the tone and strength of the thigh and leg muscles and to improve motion. Often times heat used for twenty to thirty minutes before working out will loosen up your tissues and help with improving the range of motion but do not use heat for the first two weeks following surgery (sometimes heat can increase post-operative swelling).   These exercises can be done on a training (exercise) mat, on the floor, on a table or on a bed. Use whatever works the best and is most comfortable for you.    Use music or television while you are exercising so that   the exercises are a pleasant break in your day. This will make your life better with the exercises acting as a break in your routine that you can look forward to.   Perform all exercises about fifteen times, three times per day or as directed.  You should exercise both the operative leg and the other leg as well.  Exercises include:   . Quad Sets - Tighten up the muscle on the front of the thigh (Quad) and hold for 5-10 seconds.   . Straight Leg Raises - With your knee straight (if you were given a brace, keep it on),  lift the leg to 60 degrees, hold for 3 seconds, and slowly lower the leg.  Perform this exercise against resistance later as your leg gets stronger.  . Leg Slides: Lying on your back, slowly slide your foot toward your buttocks, bending your knee up off the floor (only go as far as is comfortable). Then slowly slide your foot back down until your leg is flat on the floor again.  . Angel Wings: Lying on your back spread your legs to the side as far apart as you can without causing discomfort.  . Hamstring Strength:  Lying on your back, push your heel against the floor with your leg straight by tightening up the muscles of your buttocks.  Repeat, but this time bend your knee to a comfortable angle, and push your heel against the floor.  You may put a pillow under the heel to make it more comfortable if necessary.   A rehabilitation program following joint replacement surgery can speed recovery and prevent re-injury in the future due to weakened muscles. Contact your doctor or a physical therapist for more information on knee rehabilitation.    CONSTIPATION  Constipation is defined medically as fewer than three stools per week and severe constipation as less than one stool per week.  Even if you have a regular bowel pattern at home, your normal regimen is likely to be disrupted due to multiple reasons following surgery.  Combination of anesthesia, postoperative narcotics, change in appetite and fluid intake all can affect your bowels.   YOU MUST use at least one of the following options; they are listed in order of increasing strength to get the job done.  They are all available over the counter, and you may need to use some, POSSIBLY even all of these options:    Drink plenty of fluids (prune juice may be helpful) and high fiber foods Colace 100 mg by mouth twice a day  Senokot for constipation as directed and as needed Dulcolax (bisacodyl), take with full glass of water  Miralax (polyethylene glycol)  once or twice a day as needed.  If you have tried all these things and are unable to have a bowel movement in the first 3-4 days after surgery call either your surgeon or your primary doctor.    If you experience loose stools or diarrhea, hold the medications until you stool forms back up.  If your symptoms do not get better within 1 week or if they get worse, check with your doctor.  If you experience "the worst abdominal pain ever" or develop nausea or vomiting, please contact the office immediately for further recommendations for treatment.   ITCHING:  If you experience itching with your medications, try taking only a single pain pill, or even half a pain pill at a time.  You can also use Benadryl over the counter for itching or also to   help with sleep.   TED HOSE STOCKINGS:  Use stockings on both legs until for at least 2 weeks or as directed by physician office. They may be removed at night for sleeping.  MEDICATIONS:  See your medication summary on the "After Visit Summary" that nursing will review with you.  You may have some home medications which will be placed on hold until you complete the course of blood thinner medication.  It is important for you to complete the blood thinner medication as prescribed.  Information on my medicine - XARELTO (Rivaroxaban) This medication education was reviewed with me or my healthcare representative as part of my discharge preparation. The pharmacist that spoke with me during my hospital stay was: Theodoro Parma, [place]? Xarelto was prescribed for you to reduce the risk of blood clots forming after orthopedic surgery. The medical term for these abnormal blood clots is venous thromboembolism (VTE). WHAT DO YOU NEED TO KNOW ABOUT XARELTO? Take your Xarelto ONCE DAILY at the same time every day. You may take it either with or without food. If you have difficulty swallowing the tablet whole, you may crush  it and mix in applesauce just prior to taking your dose. Take Xarelto exactly as prescribed by your doctor and DO NOT stop taking Xarelto without talking to the doctor who prescribed the medication. Stopping without other VTE prevention medication to take the place of Xarelto may increase your risk of developing a clot. After discharge, you should have regular check-up appointments with your healthcare provider that is prescribing your Xarelto. WHAT DO YOU DO IF YOU MISS A DOSE? If you miss a dose, take it as soon as you remember on the same day then continue your regularly scheduled once daily regimen the next day. Do not take two doses of Xarelto on the same day. IMPORTANT SAFETY INFORMATION A possible side effect of Xarelto is bleeding. You should call your healthcare provider right away if you experience any of the following: ? Bleeding from an injury or your nose that does not stop. ? Unusual colored urine (red or dark brown) or unusual colored stools (red or black). ? Unusual bruising for unknown reasons. ? A serious fall or if you hit your head (even if there is no bleeding). Some medicines may interact with Xarelto and might increase your risk of bleeding while on Xarelto. To help avoid this, consult your healthcare provider or pharmacist prior to using any new prescription or non-prescription medications, including herbals, vitamins, non-steroidal antiinflammatory drugs (NSAIDs) and supplements. This website has more information on Xarelto: [email or web address]  PRECAUTIONS:  If you experience chest pain or shortness of breath - call 911 immediately for transfer to the hospital emergency department.   If you develop a fever greater that 101 F, purulent drainage from wound, increased redness or drainage from wound, foul odor from the wound/dressing, or calf pain - CONTACT YOUR SURGEON.                                                   FOLLOW-UP APPOINTMENTS:  If you do not  already have a post-op appointment, please call the office for an appointment to be seen by your surgeon.  Guidelines for how soon to be seen are listed in your "After Visit Summary", but are typically between 1-4 weeks after  surgery.  OTHER INSTRUCTIONS:   Knee Replacement:  Do not place pillow under knee, focus on keeping the knee straight while resting. CPM instructions: 0-90 degrees, 2 hours in the morning, 2 hours in the afternoon, and 2 hours in the evening. Place foam block, curve side up under heel at all times except when in CPM or when walking.  DO NOT modify, tear, cut, or change the foam block in any way.   DENTAL ANTIBIOTICS:  In most cases prophylactic antibiotics for Dental procdeures after total joint surgery are not necessary.  Exceptions are as follows:  1. History of prior total joint infection  2. Severely immunocompromised (Organ Transplant, cancer chemotherapy, Rheumatoid biologic meds such as [place])  3. Poorly controlled diabetes (A1C &gt; 8.0, blood glucose over 200)  If you have one of these conditions, contact your surgeon for an antibiotic prescription, prior to your dental procedure.   MAKE SURE YOU:  . Understand these instructions.  . Get help right away if you are not doing well or get worse.    Thank you for letting us be a part of your medical care team.  It is a privilege we respect greatly.  We hope these instructions will help you stay on track for a fast and full recovery!

## 2019-09-24 NOTE — Evaluation (Signed)
Physical Therapy Evaluation Patient Details Name: Jasmine Estrada MRN: [id number] DOB: [birth year] Today's Date: 09/24/2019   History of Present Illness  Patient is 64 y.o. female s/p Rt TKA on 09/24/19 with PMH significant for tremor, stroke, obesity, memory difficulty, asthma, OA, Rt achilles tendon lengthening.     Clinical Impression  Jasmine Estrada is a 64 y.o. female POD 0 s/p Rt TKA. Patient reports modified independence with [facility] and rollator for mobility prior to surgery. Patient is now limited by functional impairments (see PT problem list below) and requires min assist for bed mob and min-mod assist for transfers with RW. Patient was limited by Rt knee/calf pain and quad weakness requiring max assist to facilitate knee extension with weight bearing on Rt LE. Pt returned to supine at EOS and instructed in exercise to facilitate circulation. Patient will benefit from continued skilled PT interventions to address impairments and progress towards PLOF. Acute PT will follow to progress mobility and in preparation for safe discharge home.     Follow Up Recommendations Follow surgeon's recommendation for DC plan and follow-up therapies;Home health PT;SNF (HH vs SNF (pt lives alone, may need ST SNF pending progress)    Equipment Recommendations  Rolling walker with 5" wheels;3in1 (PT)    Recommendations for Other Services       Precautions / Restrictions Precautions Precautions: Fall Restrictions Weight Bearing Restrictions: No Other Position/Activity Restrictions: WABT      Mobility  Bed Mobility Overal bed mobility: Needs Assistance Bed Mobility: Supine to Sit;Sit to Supine     Supine to sit: Min assist;HOB elevated Sit to supine: Min assist;+2 for physical assistance   General bed mobility comments: cues to reach for bed rail and assist for LE mobility due to Rt knee pain. assist to raise trunk fully to sit EOB. pt able to scoot laterally up towards [facility] with min guard prior to  return to supine. Min assist +2 for trunk control and to raise LE's into bed.  Transfers Overall transfer level: Needs assistance Equipment used: Rolling walker (2 wheeled) Transfers: Sit to/from Stand Sit to Stand: Min assist;From elevated surface;+2 safety/equipment;Mod assist         General transfer comment: min-mod assist for power up from EOB and cues for safe technique/hand placement. manual facilitattion/blocking at Rt knee to prevent buckling at EOB. pt attempted 2 small side steps for stand step transfer, max assist to maintain extension on Rt LE. returned to sit EOB.  Ambulation/Gait         Stairs   Wheelchair Mobility    Modified Rankin (Stroke Patients Only)       Balance Overall balance assessment: Needs assistance Sitting-balance support: Feet supported Sitting balance-Leahy Scale: Good     Standing balance support: During functional activity;Bilateral upper extremity supported Standing balance-Leahy Scale: Poor                Pertinent Vitals/Pain Pain Assessment: 0-10 Pain Score: 8  Pain Location: Rt knee/calf Pain Descriptors / Indicators: Aching;Cramping;Discomfort Pain Intervention(s): Limited activity within patient's tolerance;Monitored during session;Repositioned;RN gave pain meds during session;Ice applied    Home Living Family/patient expects to be discharged to:: Private residence Living Arrangements: Alone Available Help at Discharge: Family;Friend(s) Type of Home: Apartment (senior apartments) Home Access: Level entry;Ramped entrance     [place]a: One [place]: [place];Shower seat;Grab bars - toilet;Walker - 4 wheels;Walker - standard;Cane - single point      Prior Function Level of Independence: Independent with  assistive device(s)         Comments: pt uses SPC and rollator in home and community     Hand Dominance   Dominant Hand: Right    Extremity/Trunk Assessment   Upper  Extremity Assessment Upper Extremity Assessment: Overall WFL for tasks assessed    Lower Extremity Assessment Lower Extremity Assessment: Generalized weakness;RLE deficits/detail RLE Deficits / Details: pt with poor/limited quad activation, secondary to pain. unable to complete SLR, LAQ.  RLE: Unable to fully assess due to pain RLE Sensation: WNL RLE Coordination: decreased gross motor    Cervical / Trunk Assessment Cervical / Trunk Assessment: Other exceptions Cervical / Trunk Exceptions: large habitus  Communication   Communication: No difficulties  Cognition Arousal/Alertness: Awake/alert Behavior During Therapy: WFL for tasks assessed/performed Overall Cognitive Status: Within Functional Limits for tasks assessed            General Comments: pt pleasant, alert, and oriented. has a history of memory deficits.      General Comments      Exercises Total Joint Exercises Ankle Circles/Pumps: AROM;Both;Seated;10 reps;20 reps;Supine   Assessment/Plan    PT Assessment Patient needs continued PT services  PT Problem List Decreased strength;Decreased range of motion;Decreased activity tolerance;Decreased balance;Decreased mobility;Decreased knowledge of use of DME;Decreased knowledge of precautions;Obesity;Pain       PT Treatment Interventions DME instruction;Gait training;Stair training;Therapeutic activities;Therapeutic exercise;Functional mobility training;Balance training;Patient/family education    PT Goals (Current goals can be found in the Care Plan section)  Acute Rehab PT Goals Patient Stated Goal: go home PT Goal Formulation: With patient Time For Goal Achievement: 10/01/19 Potential to Achieve Goals: Good    Frequency 7X/week   Barriers to discharge       AM-PAC PT "6 Clicks" Mobility  Outcome Measure Help needed turning from your back to your side while in a flat bed without using bedrails?: A Little Help needed moving from lying on your back to  sitting on the side of a flat bed without using bedrails?: A Little Help needed moving to and from a bed to a chair (including a wheelchair)?: A Lot Help needed standing up from a chair using your arms (e.g., wheelchair or bedside chair)?: A Lot Help needed to walk in hospital room?: A Lot Help needed climbing 3-5 steps with a railing? : Total 6 Click Score: 13    End of Session Equipment Utilized During Treatment: Gait belt Activity Tolerance: Patient limited by pain Patient left: in bed;with call bell/phone within reach;with SCD's reapplied (rehab tech in room at EOS)   PT Visit Diagnosis: Difficulty in walking, not elsewhere classified (R26.2);Muscle weakness (generalized) (M62.81);Pain Pain - Right/Left: Right Pain - part of body: Knee    Time: 1308-6578 PT Time Calculation (min) (ACUTE ONLY): 22 min   Charges:   PT Evaluation $PT Eval Low Complexity: 1 Low          Verner Mould, DPT [place]  Office [phone] Pager [phone]  09/24/2019 4:33 PM

## 2019-09-24 NOTE — Anesthesia Postprocedure Evaluation (Signed)
Anesthesia Post Note  Patient: Jasmine Estrada  Procedure(s) Performed: TOTAL KNEE ARTHROPLASTY (Right Knee)     Patient location during evaluation: PACU Anesthesia Type: General Level of consciousness: awake and alert, oriented and patient cooperative Pain management: pain level controlled Vital Signs Assessment: post-procedure vital signs reviewed and stable Respiratory status: spontaneous breathing, nonlabored ventilation and respiratory function stable Cardiovascular status: blood pressure returned to baseline and stable Postop Assessment: no apparent nausea or vomiting, spinal receding and patient able to bend at knees Anesthetic complications: no   No complications documented.  Last Vitals:  Vitals:   09/24/19 1031 09/24/19 1050  BP:  124/87  Pulse: (!) 59 (!) 48  Resp: 19 15  Temp:  (!) 36.1 C  SpO2: 98% 99%    Last Pain:  Vitals:   09/24/19 1050  TempSrc: Axillary  PainSc:                  Ellieana Dolecki,E. Brolin Dambrosia

## 2019-09-24 NOTE — Anesthesia Procedure Notes (Signed)
Anesthesia Regional Block: Adductor canal block   Pre-Anesthetic Checklist: ,, timeout performed, Correct Patient, Correct Site, Correct Laterality, Correct Procedure, Correct Position, site marked, Risks and benefits discussed,  Surgical consent,  Pre-op evaluation,  At surgeon's request and post-op pain management  Laterality: Right and Lower  Prep: chloraprep       Needles:  Injection technique: Single-shot  Needle Type: Echogenic Needle     Needle Length: 9cm  Needle Gauge: 21     Additional Needles:   Procedures:,,,, ultrasound used (permanent image in chart),,,,  Narrative:  Start time: 09/24/2019 7:01 AM End time: 09/24/2019 7:07 AM Injection made incrementally with aspirations every 5 mL.  Performed by: Personally  Anesthesiologist: Maxwell Caul, CRNA  Additional Notes: Pt identified in Holding room.  Monitors applied. Working IV access confirmed. Sterile prep R thigh.  #21ga ECHOgenic needle into adductor canal with US guidance.  20cc 0.75% Ropivacaine injected incrementally after negative test dose.  Patient asymptomatic, VSS, no heme aspirated, tolerated well.  Jenita Seashore, MD

## 2019-09-24 NOTE — Interval H&P Note (Signed)
History and Physical Interval Note:  09/24/2019 7:20 AM  Jasmine Estrada  has presented today for surgery, with the diagnosis of Right knee osteoarthritis.  The various methods of treatment have been discussed with the patient and family. After consideration of risks, benefits and other options for treatment, the patient has consented to  Procedure(s) with comments: TOTAL KNEE ARTHROPLASTY (Right) - 62mins as a surgical intervention.  The patient's history has been reviewed, patient examined, no change in status, stable for surgery.  I have reviewed the patient's chart and labs.  Questions were answered to the patient's satisfaction.     Mauri Pole

## 2019-09-24 NOTE — H&P (Signed)
TOTAL KNEE ADMISSION H&P  Patient is being admitted for right total knee arthroplasty.  Subjective:  Chief Complaint:right knee pain.  HPI: Jasmine Estrada, 64 y.o. female, has a history of pain and functional disability in the right knee due to arthritis and has failed non-surgical conservative treatments for greater than 12 weeks to includecorticosteriod injections and activity modification.  Onset of symptoms was gradual, starting 2 years ago with gradually worsening course since that time. The patient noted no past surgery on the right knee(s).  Patient currently rates pain in the right knee(s) at 9 out of 10 with activity. Patient has worsening of pain with activity and weight bearing and pain that interferes with activities of daily living.  Patient has evidence of joint space narrowing by imaging studies. There is no active infection.  Patient Active Problem List   Diagnosis Date Noted  . Ankle edema 04/12/2015  . Status post right foot surgery 03/02/2015  . Equinus deformity of foot, acquired 02/02/2015  . Tenosynovitis of ankle 01/03/2015  . Posterior tibial tendon dysfunction 01/03/2015  . Onychomycosis 01/03/2015  . Urge urinary incontinence 11/11/2014  . Hematuria, microscopic 09/23/2014  . OAB (overactive bladder) 09/23/2014  . Lumbago 08/12/2013  . Pain in limb 05/25/2013  . Essential and other specified forms of tremor 01/26/2013  . Memory difficulty 01/26/2013   Past Medical History:  Diagnosis Date  . Arthritis    legs  . Asthma    prn inhaler  . Cough 07/15/2012  . Dyspnea   . Essential and other specified forms of tremor 01/26/2013  . Full dentures   . Mass of knee 07/2012   left  . Memory difficulty 01/26/2013  . Obesity   . Overactive bladder 2019  . Sleep apnea   . Stroke [facility])    right sided-weakness  . Tremor    right foot  . Urinary frequency     Past Surgical History:  Procedure Laterality Date  . ACHILLES TENDON LENGTHENING Right 02/25/2015  .  BOTOX INJECTION N/A 11/19/2017   Procedure: CYSTOSCOPY BOTOX INJECTION;  Surgeon: Bjorn Loser, MD;  Location: [facility];  Service: Urology;  Laterality: N/A;  . MASS EXCISION Left 07/21/2012   Procedure: EXCISION OF LARGE MASS LEFT KNEE WITH LIPOSUCTION ASSISTED;  Surgeon: Cristine Polio, MD;  Location: [place];  Service: Plastics;  Laterality: Left;  . TUBAL LIGATION  1982    Current Facility-Administered Medications  Medication Dose Route Frequency Provider Last Rate Last Admin  . ceFAZolin (ANCEF) IVPB 2g/100 mL premix  2 g Intravenous On Call to OR Babish, Rodman Key, PA-C      . dexamethasone (DECADRON) injection 10 mg  10 mg Intravenous Once Babish, Matthew, PA-C      . lactated ringers infusion   Intravenous Continuous Nolon Nations, MD 20 mL/hr at 09/24/19 7858 Continued from Pre-op at 09/24/19 0628  . tranexamic acid (CYKLOKAPRON) IVPB 1,000 mg  1,000 mg Intravenous To OR Babish, Matthew, PA-C       No Known Allergies  Social History   Tobacco Use  . Smoking status: Current Every Day Smoker    Packs/day: 0.50    Years: 44.00    Pack years: 22.00    Types: Cigarettes  . Smokeless tobacco: Never Used  . Tobacco comment: 1 pack every 4 days  Substance Use Topics  . Alcohol use: No    Alcohol/week: 0.0 standard drinks    Family History  Problem Relation Age of Onset  .  Cancer Sister   . Diabetes Sister   . Mental illness Brother   . Alzheimer's disease Maternal Aunt   . Alzheimer's disease Maternal Grandmother   . Mental illness Sister      Review of Systems  Constitutional: Negative for chills and fever.  Respiratory: Negative for shortness of breath.   Cardiovascular: Negative for chest pain.  Gastrointestinal: Negative for nausea and vomiting.  Musculoskeletal: Positive for arthralgias.    Objective:  Physical Exam Patient is a 64 year old female.  Well nourished and well developed. General: Alert and oriented x3,  cooperative and pleasant, no acute distress. Head: normocephalic, atraumatic, neck supple. Eyes: EOMI. Respiratory: breath sounds clear in all fields, no wheezing, rales, or rhonchi. Cardiovascular: Regular rate and rhythm, no murmurs, gallops or rubs. Abdomen: non-tender to palpation and soft, normoactive bowel sounds.  Musculoskeletal: Right Knee: Valgus deformity. Moderate tenderness to palpation about the medial and lateral joint line of the right knee. AROM 0-110 degrees. No effusion noted. No instability. Mild patellofemoral crepitus.  Calves soft and nontender. Motor function intact in LE. Strength 5/5 LE bilaterally. Neuro: Distal pulses 2+. Sensation to light touch intact in LE.  Vital signs in last 24 hours: Temp:  [99.3 F (37.4 C)] 99.3 F (37.4 C) (06/10 0545) Pulse Rate:  [75] 75 (06/10 0545) Resp:  [21] 21 (06/10 0545) BP: (120)/(77) 120/77 (06/10 0545) SpO2:  [98 %] 98 % (06/10 0545) Weight:  [104.4 kg] 104.4 kg (06/10 0550)  Labs:   Estimated body mass index is 37.7 kg/m as calculated from the following:   Height as of this encounter: 5' 5.5" (1.664 m).   Weight as of this encounter: 104.4 kg.   Imaging Review Plain radiographs demonstrate severe degenerative joint disease of the right knee(s). The overall alignment isneutral. The bone quality appears to be adequate for age and reported activity level.  Assessment/Plan:  End stage arthritis, right knee   The patient history, physical examination, clinical judgment of the provider and imaging studies are consistent with end stage degenerative joint disease of the right knee(s) and total knee arthroplasty is deemed medically necessary. The treatment options including medical management, injection therapy arthroscopy and arthroplasty were discussed at length. The risks and benefits of total knee arthroplasty were presented and reviewed. The risks due to aseptic loosening, infection, stiffness, patella tracking  problems, thromboembolic complications and other imponderables were discussed. The patient acknowledged the explanation, agreed to proceed with the plan and consent was signed. Patient is being admitted for inpatient treatment for surgery, pain control, PT, OT, prophylactic antibiotics, VTE prophylaxis, progressive ambulation and ADL's and discharge planning. The patient is planning to be discharged home.  Therapy Plans: HHPT and then outpatient therapy at [facility] Disposition: Home with significant other and CNA in mornings Planned DVT Prophylaxis: Xarelto 10mg  daily DME needed: none PCP: Dr. Dustin Folks, clearance received TXA: IV Allergies: NKDA Anesthesia Concerns: none BMI: 35.9 Last HgbA1c: 5.0%, not diabetic  Other: Hx of MI in 1992. Hx of stroke in 2014 years ago, and two TIAs since then. Hx of bladder cancer in 2014.  Takes Norco 5 BID PRN pain.  Anticipated LOS equal to or greater than 2 midnights due to - Age 66 and older with one or more of the following:  - Obesity  - Expected need for hospital services (PT, OT, Nursing) required for safe  discharge  - Anticipated need for postoperative skilled nursing care or inpatient rehab  - Active co-morbidities: Chronic pain requiring opiods OR   -  Unanticipated findings during/Post Surgery: None  - Patient is a high risk of re-admission due to: None   - Patient was instructed on what medications to stop prior to surgery. - Follow-up visit in 2 weeks with Dr. Alvan Dame - Begin physical therapy following surgery - Pre-operative lab work as pre-surgical testing - Prescriptions will be provided in hospital at time of discharge  Griffith Citron, PA-C Orthopedic Surgery [facility] [place] [phone]

## 2019-09-24 NOTE — Anesthesia Procedure Notes (Signed)
Spinal  Patient location during procedure: OR End time: 09/24/2019 7:38 AM Staffing Performed: anesthesiologist and resident/CRNA  Anesthesiologist: Annye Asa, MD Resident/CRNA: Maxwell Caul, CRNA Preanesthetic Checklist Completed: patient identified, IV checked, site marked, risks and benefits discussed, surgical consent, monitors and equipment checked, pre-op evaluation and timeout performed Spinal Block Patient position: sitting Prep: DuraPrep and site prepped and draped Patient monitoring: blood pressure, continuous pulse ox, cardiac monitor and heart rate Approach: midline Location: L3-4 Injection technique: single-shot Needle Needle type: Pencan  Needle gauge: 24 G Needle length: 9 cm Additional Notes Pt identified in Operating room.  Monitors applied. Working IV access confirmed. Sterile prep, drape lumbar spine.  1% lido local L 3,4.  CRNA unsuccessful, then [place] with #24ga Pencan into clear CSF L 3,4.  13.5mg  0.75% Bupivacaine with dextrose injected with asp CSF beginning and end of injection.  Patient asymptomatic, VSS, no heme aspirated, tolerated well.  Jenita Seashore, MD

## 2019-09-24 NOTE — Anesthesia Procedure Notes (Addendum)
Procedure Name: MAC Date/Time: 09/24/2019 7:24 AM Performed by: Maxwell Caul, CRNA Pre-anesthesia Checklist: Patient identified, Emergency Drugs available, Suction available, Patient being monitored and Timeout performed Patient Re-evaluated:Patient Re-evaluated prior to induction Oxygen Delivery Method: Simple face mask

## 2019-09-24 NOTE — Op Note (Signed)
NAME:  Jasmine Estrada                      MEDICAL RECORD NO.:  [id number]                             FACILITY:  [facility]      PHYSICIAN:  Pietro Cassis. Alvan Dame, M.D.  DATE OF BIRTH:  1955/08/21      DATE OF PROCEDURE:  09/24/2019                                     OPERATIVE REPORT         PREOPERATIVE DIAGNOSIS:  Right knee osteoarthritis.      POSTOPERATIVE DIAGNOSIS:  Right knee osteoarthritis.      FINDINGS:  The patient was noted to have complete loss of cartilage and   bone-on-bone arthritis with associated osteophytes in the lateral and patellofemoral compartments of   the knee with a significant synovitis and associated effusion.  The patient had failed months of conservative treatment including medications, injection therapy, activity modification.     PROCEDURE:  Right total knee replacement.      COMPONENTS USED:  DePuy Attune rotating platform posterior stabilized knee   system, a size 5N femur, 5 tibia, size 6 mm PS AOX insert, and 38 anatomic patellar   button.      SURGEON:  Pietro Cassis. Alvan Dame, M.D.      ASSISTANT:  Griffith Citron, PA-C.      ANESTHESIA:  General, Regional and Spinal.      SPECIMENS:  None.      COMPLICATION:  None.      DRAINS:  None.  EBL: <200cc      TOURNIQUET TIME:   Total Tourniquet Time Documented: Thigh (Right) - 32 minutes Total: Thigh (Right) - 32 minutes  .      The patient was stable to the recovery room.      INDICATION FOR PROCEDURE:  Jasmine Estrada is a 64 y.o. female patient of   mine.  The patient had been seen, evaluated, and treated for months conservatively in the   office with medication, activity modification, and injections.  The patient had   radiographic changes of bone-on-bone arthritis with endplate sclerosis and osteophytes noted.  Based on the radiographic changes and failed conservative measures, the patient   decided to proceed with definitive treatment, total knee replacement.  Risks of infection, DVT, component  failure, need for revision surgery, neurovascular injury were reviewed in the office setting.  The postop course was reviewed stressing the efforts to maximize post-operative satisfaction and function.  Consent was obtained for benefit of pain   relief.      PROCEDURE IN DETAIL:  The patient was brought to the operative theater.   Once adequate anesthesia, preoperative antibiotics, 2 gm of Ancef,1 gm of Tranexamic Acid, and 10 mg of Decadron administered, the patient was positioned supine with a right thigh tourniquet placed.  The  right lower extremity was prepped and draped in sterile fashion.  A time-   out was performed identifying the patient, planned procedure, and the appropriate extremity.      The right lower extremity was placed in the [facility] leg holder.  The leg was   exsanguinated, tourniquet elevated to 250 mmHg.  A midline incision was  made followed by median parapatellar arthrotomy.  Following initial   exposure, attention was first directed to the patella.  Precut   measurement was noted to be 24 mm.  I resected down to 14 mm and used a   38 anatomic patellar button to restore patellar height as well as cover the cut surface.      The lug holes were drilled and a metal shim was placed to protect the   patella from retractors and saw blade during the procedure.      At this point, attention was now directed to the femur.  The femoral   canal was opened with a drill, irrigated to try to prevent fat emboli.  An   intramedullary rod was passed at 3 degrees valgus, 9 mm of bone was   resected off the distal femur.  Following this resection, the tibia was   subluxated anteriorly.  Using the extramedullary guide, 2 mm of bone was resected off   the proximal lateral tibia.  We confirmed the gap would be   stable medially and laterally with a size 5 spacer block as well as confirmed that the tibial cut was perpendicular in the coronal plane, checking with an alignment rod.       Once this was done, I sized the femur to be a size 5 in the anterior-   posterior dimension, chose a narrow component based on medial and   lateral dimension.  The size 5 rotation block was then pinned in   position anterior referenced using the C-clamp to set rotation.  The   anterior, posterior, and  chamfer cuts were made without difficulty nor   notching making certain that I was along the anterior cortex to help   with flexion gap stability.      The final box cut was made off the lateral aspect of distal femur.      At this point, the tibia was sized to be a size 5.  The size 5 tray was   then pinned in position through the medial third of the tubercle,   drilled, and keel punched.  Trial reduction was now carried with a 5 femur,  5 tibia, a size 6 mm PS insert, and the 38 anatomic patella botton.  The knee was brought to full extension with good flexion stability with the patella   tracking through the trochlea without application of pressure.  Given   all these findings the trial components removed.  Final components were   opened and cement was mixed.  The knee was irrigated with normal saline solution and pulse lavage.  The synovial lining was   then injected with 30 cc of 0.25% Marcaine with epinephrine, 1 cc of Toradol and 30 cc of NS for a total of 61 cc.     Final implants were then cemented onto cleaned and dried cut surfaces of bone with the knee brought to extension with a size 6 mm PS trial insert.      Once the cement had fully cured, excess cement was removed   throughout the knee.  I confirmed that I was satisfied with the range of   motion and stability, and the final size 6 mm PS AOX insert was chosen.  It was   placed into the knee.      The tourniquet had been let down at 32 minutes.  No significant   hemostasis was required.  The extensor mechanism was then reapproximated using #1 Vicryl  and #1 Stratafix sutures with the knee   in flexion.  The   remaining wound  was closed with 2-0 Vicryl and running 4-0 Monocryl.   The knee was cleaned, dried, dressed sterilely using Dermabond and   Aquacel dressing.  The patient was then   brought to recovery room in stable condition, tolerating the procedure   well.   Please note that Physician Assistant, Griffith Citron, PA-C was present for the entirety of the case, and was utilized for pre-operative positioning, peri-operative retractor management, general facilitation of the procedure and for primary wound closure at the end of the case.              Pietro Cassis Alvan Dame, M.D.    09/24/2019 9:00 AM

## 2019-09-24 NOTE — Progress Notes (Signed)
Pt has refused CPAP QHS, RT to monitor and assess as needed.

## 2019-09-24 NOTE — Transfer of Care (Signed)
Immediate Anesthesia Transfer of Care Note  Patient: Jasmine Estrada  Procedure(s) Performed: TOTAL KNEE ARTHROPLASTY (Right Knee)  Patient Location: PACU  Anesthesia Type:General, Regional and Spinal  Level of Consciousness: awake, alert  and oriented  Airway & Oxygen Therapy: Patient Spontanous Breathing and Patient connected to face mask oxygen  Post-op Assessment: Report given to RN and Post -op Vital signs reviewed and stable  Post vital signs: Reviewed and stable  Last Vitals:  Vitals Value Taken Time  BP 142/75 09/24/19 0930  Temp    Pulse 44 09/24/19 0931  Resp 18 09/24/19 0931  SpO2 100 % 09/24/19 0931  Vitals shown include unvalidated device data.  Last Pain:  Vitals:   09/24/19 0550  TempSrc:   PainSc: 5       Patients Stated Pain Goal: 4 (38/88/28 0034)  Complications: No complications documented.

## 2019-09-24 NOTE — Anesthesia Procedure Notes (Signed)
Procedure Name: LMA Insertion Date/Time: 09/24/2019 8:06 AM Performed by: Maxwell Caul, CRNA Pre-anesthesia Checklist: Patient identified, Emergency Drugs available, Suction available and Patient being monitored Patient Re-evaluated:Patient Re-evaluated prior to induction Oxygen Delivery Method: Circle system utilized Preoxygenation: Pre-oxygenation with 100% oxygen Induction Type: IV induction LMA: LMA inserted LMA Size: 4.0 Number of attempts: 1 Placement Confirmation: positive ETCO2 and breath sounds checked- equal and bilateral Tube secured with: Tape Dental Injury: Teeth and Oropharynx as per pre-operative assessment

## 2019-09-25 ENCOUNTER — Encounter (HOSPITAL_COMMUNITY): Payer: Self-pay | Admitting: Orthopedic Surgery

## 2019-09-25 DIAGNOSIS — E669 Obesity, unspecified: Secondary | ICD-10-CM | POA: Diagnosis present

## 2019-09-25 DIAGNOSIS — M1711 Unilateral primary osteoarthritis, right knee: Secondary | ICD-10-CM | POA: Diagnosis not present

## 2019-09-25 LAB — CBC
HCT: 32.3 % — ABNORMAL LOW (ref 36.0–46.0)
Hemoglobin: 10.5 g/dL — ABNORMAL LOW (ref 12.0–15.0)
MCH: 31.2 pg (ref 26.0–34.0)
MCHC: 32.5 g/dL (ref 30.0–36.0)
MCV: 95.8 fL (ref 80.0–100.0)
Platelets: 154 10*3/uL (ref 150–400)
RBC: 3.37 MIL/uL — ABNORMAL LOW (ref 3.87–5.11)
RDW: 13.7 % (ref 11.5–15.5)
WBC: 10.4 10*3/uL (ref 4.0–10.5)
nRBC: 0 % (ref 0.0–0.2)

## 2019-09-25 LAB — BASIC METABOLIC PANEL
Anion gap: 4 — ABNORMAL LOW (ref 5–15)
BUN: 13 mg/dL (ref 8–23)
CO2: 27 mmol/L (ref 22–32)
Calcium: 9.6 mg/dL (ref 8.9–10.3)
Chloride: 109 mmol/L (ref 98–111)
Creatinine, Ser: 0.68 mg/dL (ref 0.44–1.00)
GFR calc Af Amer: >60 mL/min (ref >60)
GFR calc non Af Amer: >60 mL/min (ref >60)
Glucose, Bld: 109 mg/dL — ABNORMAL HIGH (ref 70–99)
Potassium: 4.4 mmol/L (ref 3.5–5.1)
Sodium: 140 mmol/L (ref 135–145)

## 2019-09-25 MED ORDER — HYDROCODONE-ACETAMINOPHEN 7.5-325 MG PO TABS: 1.0000 | ORAL_TABLET | ORAL | 0 refills | Status: DC | PRN

## 2019-09-25 MED ORDER — METHOCARBAMOL 500 MG PO TABS: 500.0000 mg | ORAL_TABLET | Freq: Four times a day (QID) | ORAL | 0 refills | Status: DC | PRN

## 2019-09-25 MED ORDER — RIVAROXABAN 10 MG PO TABS
10.0000 mg | ORAL_TABLET | Freq: Every day | ORAL | 0 refills | Status: DC
Start: 2019-09-25 — End: 2021-03-20

## 2019-09-25 MED ORDER — FERROUS SULFATE 325 (65 FE) MG PO TABS: 325.0000 mg | ORAL_TABLET | Freq: Three times a day (TID) | ORAL | 0 refills | Status: DC

## 2019-09-25 MED ORDER — POLYETHYLENE GLYCOL 3350 17 G PO PACK
17.0000 g | PACK | Freq: Two times a day (BID) | ORAL | 0 refills | Status: DC
Start: 2019-09-25 — End: 2021-03-20

## 2019-09-25 MED ORDER — DOCUSATE SODIUM 100 MG PO CAPS
100.0000 mg | ORAL_CAPSULE | Freq: Two times a day (BID) | ORAL | 0 refills | Status: DC
Start: 2019-09-25 — End: 2021-03-20

## 2019-09-25 NOTE — Progress Notes (Signed)
     Subjective: 1 Day Post-Op Procedure(s) (LRB): TOTAL KNEE ARTHROPLASTY (Right)   Patient reports pain as mild, pain controlled. No reported events throughout the night.  Discussed the procedure and expectations moving forward.  Ready to be discharged home, if she does well with PT.  Follow up in the clinic in 2 weeks.  She knows to call with any questions or concerns.    Patient's anticipated LOS is less than 2 midnights, meeting these requirements: - Younger than 12 - Lives within 1 hour of care - Has a competent adult at home to recover with post-op recover - NO history of  - Chronic pain requiring opiods  - Diabetes  - Coronary Artery Disease  - Heart failure  - Heart attack  - Stroke  - DVT/VTE  - Cardiac arrhythmia  - Respiratory Failure/COPD  - Renal failure  - Anemia  - Advanced Liver disease    Objective:   VITALS:   Vitals:   09/25/19 0143 09/25/19 0458  BP: 118/61 117/63  Pulse: (!) 56 (!) 56  Resp: 15 16  Temp: 98 F (36.7 C) 98.4 F (36.9 C)  SpO2: 96% 99%    Dorsiflexion/Plantar flexion intact Incision: dressing C/D/I No cellulitis present Compartment soft  LABS Recent Labs    09/25/19 0330  HGB 10.5*  HCT 32.3*  WBC 10.4  PLT 154    Recent Labs    09/25/19 0330  NA 140  K 4.4  BUN 13  CREATININE 0.68  GLUCOSE 109*     Assessment/Plan: 1 Day Post-Op Procedure(s) (LRB): TOTAL KNEE ARTHROPLASTY (Right) Foley cath d/c'ed Advance diet Up with therapy D/C IV fluids Discharge home Follow [facility] in 2 [facility] Follow up with OLIN,Dezyre Hoefer D in 2 weeks.  Contact information:  [facility] [address], [address] [phone]    Obese (BMI 30-39.9) Estimated body mass index is 37.7 kg/m as calculated from the following:   Height as of this encounter: 5' 5.5" (1.664 m).   Weight as of this encounter: 104.4 kg. Patient also counseled that weight may inhibit the healing  process Patient counseled that losing weight will help with future health issues       Danae Orleans PA-C  [facility]  [place] [address], [address], [place], [place] [address] Phone: [phone] [email or web address] Facebook  [employer]

## 2019-09-25 NOTE — Progress Notes (Signed)
Physical Therapy Treatment Patient Details Name: Jasmine Estrada MRN: [id number] DOB: Oct 25, 1955 Today's Date: 09/25/2019    History of Present Illness Patient is 64 y.o. female s/p Rt TKA on 09/24/19 with PMH significant for tremor, stroke, obesity, memory difficulty, asthma, OA, Rt achilles tendon lengthening.     PT Comments    Pt ambulated only short distance due to pain and requiring min-mod assist for mobility at this time.  Pt eager to d/c home today.  Pt does report she will have assist at home and has a w/c for mobility.  Pt performed LE exercises although requiring increased assist due to pain and weakness.  Will return for afternoon session.   Follow Up Recommendations  Follow surgeon's recommendation for DC plan and follow-up therapies;Home health PT;Supervision/Assistance - 24 hour     Equipment Recommendations  Rolling walker with 5" wheels;3in1 (PT)    Recommendations for Other Services       Precautions / Restrictions Precautions Precautions: Fall;Knee Restrictions Other Position/Activity Restrictions: WBAT    Mobility  Bed Mobility               General bed mobility comments: pt up in recliner  Transfers Overall transfer level: Needs assistance Equipment used: Rolling walker (2 wheeled) Transfers: Sit to/from Stand Sit to Stand: Mod assist         General transfer comment: verbal cues for UE and LE positioning, assist to rise and steady, increased difficulty for pt to rise; assist for control with return to sitting due to fatigue  Ambulation/Gait Ambulation/Gait assistance: Min assist Gait Distance (Feet): 12 Feet Assistive device: Rolling walker (2 wheeled) Gait Pattern/deviations: Step-to pattern;Decreased stance time - right Gait velocity: decr   General Gait Details: verbal cues for sequence, RW positioning, weight shifting; pt with increased difficulty advancing LEs so performed weight shifting between L and R prior to taking steps; limited  distance due to pain   Stairs             Wheelchair Mobility    Modified Rankin (Stroke Patients Only)       Balance                                            Cognition Arousal/Alertness: Awake/alert Behavior During Therapy: WFL for tasks assessed/performed Overall Cognitive Status: Within Functional Limits for tasks assessed                                        Exercises Total Joint Exercises Ankle Circles/Pumps: AROM;Both;10 reps Quad Sets: AROM;Both;10 reps Heel Slides: AAROM;Right;10 reps Hip ABduction/ADduction: AAROM;Right;10 reps Straight Leg Raises: AAROM;Right;10 reps    General Comments        Pertinent Vitals/Pain Pain Assessment: 0-10 Pain Score: 8  Pain Location: Rt knee/calf Pain Descriptors / Indicators: Tender;Sore;Cramping Pain Intervention(s): Repositioned;Monitored during session    Home Living                      Prior Function            PT Goals (current goals can now be found in the care plan section) Progress towards PT goals: Progressing toward goals    Frequency    7X/week      PT Plan Current plan remains appropriate  Co-evaluation              AM-PAC PT "6 Clicks" Mobility   Outcome Measure  Help needed turning from your back to your side while in a flat bed without using bedrails?: A Little Help needed moving from lying on your back to sitting on the side of a flat bed without using bedrails?: A Little Help needed moving to and from a bed to a chair (including a wheelchair)?: A Lot Help needed standing up from a chair using your arms (e.g., wheelchair or bedside chair)?: A Lot Help needed to walk in hospital room?: A Lot Help needed climbing 3-5 steps with a railing? : Total 6 Click Score: 13    End of Session Equipment Utilized During Treatment: Gait belt Activity Tolerance: Patient limited by pain Patient left: with call bell/phone within reach;in  chair Nurse Communication: Mobility status PT Visit Diagnosis: Other abnormalities of gait and mobility (R26.89)     Time: 0998-3382 PT Time Calculation (min) (ACUTE ONLY): 20 min  Charges:  $Therapeutic Exercise: 8-22 mins                     Arlyce Dice, DPT Acute Rehabilitation Services Pager: [phone] Office: [phone]  York Ram E 09/25/2019, 12:12 PM

## 2019-09-25 NOTE — Plan of Care (Signed)
Patient discharged home in stable condition 

## 2019-09-25 NOTE — TOC Transition Note (Signed)
Transition of Care [facility]) - CM/SW Discharge Note   Patient Details  Name: Jasmine Estrada MRN: [id number] Date of Birth: 04-27-1955  Transition of Care [facility]) CM/SW Contact:  Lennart Pall, LCSW Phone Number: 09/25/2019, 11:16 AM   Clinical Narrative:   Met with pt this morning and confirming plan for OPPT at [facility].  Pt states she does need a 3n1 commode - order place with [employer].  No futher TOC needs.    Final next level of care: OP Rehab Barriers to Discharge: Barriers Resolved   Patient Goals and CMS Choice Patient states their goals for this hospitalization and ongoing recovery are:: go home      Discharge Placement                       Discharge Plan and Services                DME Arranged: 3-N-1 DME Agency: [employer] Date DME Agency Contacted: 09/25/19 Time DME Agency Contacted: 0900 Representative spoke with at DME Agency: Ovid Curd HH Arranged: NA HH Agency: NA        Social Determinants of Health ([place]) Interventions     Readmission Risk Interventions No flowsheet data found.

## 2019-09-25 NOTE — Progress Notes (Signed)
Physical Therapy Treatment Patient Details Name: Jasmine Estrada MRN: [id number] DOB: 1955/09/28 Today's Date: 09/25/2019    History of Present Illness Patient is 64 y.o. female s/p Rt TKA on 09/24/19 with PMH significant for tremor, stroke, obesity, memory difficulty, asthma, OA, Rt achilles tendon lengthening.     PT Comments    Pt with less pain this afternoon however still with limited mobility.  Pt able to ambulate close min/guard around room however fatigues quickly.  Pt determined to d/c home today.  Pt states Elberta Fortis will assist her home and granddaughter will be with her at 7 pm.  Strongly encouraged use of gait belt and having family assist her with mobility for safety.  Pt would benefit from HHPT as well.  Discussed with RN.    Follow Up Recommendations  Follow surgeon's recommendation for DC plan and follow-up therapies;Home health PT;Supervision/Assistance - 24 hour     Equipment Recommendations  Rolling walker with 5" wheels;3in1 (PT)    Recommendations for Other Services       Precautions / Restrictions Precautions Precautions: Fall;Knee Restrictions Other Position/Activity Restrictions: WBAT    Mobility  Bed Mobility               General bed mobility comments: pt up in recliner  Transfers Overall transfer level: Needs assistance Equipment used: Rolling walker (2 wheeled) Transfers: Sit to/from Stand Sit to Stand: Min guard         General transfer comment: verbal cues for UE and LE positioning  Ambulation/Gait Ambulation/Gait assistance: Min guard Gait Distance (Feet): 12 Feet Assistive device: Rolling walker (2 wheeled) Gait Pattern/deviations: Step-to pattern;Decreased stance time - right;Antalgic Gait velocity: decr   General Gait Details: verbal cues for sequence, RW positioning, weight shifting; pt with increased difficulty advancing LEs and uncoordinated gait however close min/guard for safety; pt fatigues quickly   Stairs              Wheelchair Mobility    Modified Rankin (Stroke Patients Only)       Balance                                            Cognition Arousal/Alertness: Awake/alert Behavior During Therapy: WFL for tasks assessed/performed Overall Cognitive Status: Within Functional Limits for tasks assessed                                        Exercises     General Comments        Pertinent Vitals/Pain Pain Assessment: 0-10 Pain Score: 6  Pain Location: Rt knee/calf Pain Descriptors / Indicators: Tender;Sore;Cramping Pain Intervention(s): Limited activity within patient's tolerance;Monitored during session;Repositioned    Home Living                      Prior Function            PT Goals (current goals can now be found in the care plan section) Progress towards PT goals: Progressing toward goals    Frequency    7X/week      PT Plan Current plan remains appropriate    Co-evaluation              AM-PAC PT "6 Clicks" Mobility   Outcome Measure  Help needed turning  from your back to your side while in a flat bed without using bedrails?: A Little Help needed moving from lying on your back to sitting on the side of a flat bed without using bedrails?: A Little Help needed moving to and from a bed to a chair (including a wheelchair)?: A Little Help needed standing up from a chair using your arms (e.g., wheelchair or bedside chair)?: A Little Help needed to walk in hospital room?: A Little Help needed climbing 3-5 steps with a railing? : Total 6 Click Score: 16    End of Session Equipment Utilized During Treatment: Gait belt Activity Tolerance: Patient limited by pain;Patient limited by fatigue Patient left: with call bell/phone within reach;in chair Nurse Communication: Mobility status PT Visit Diagnosis: Other abnormalities of gait and mobility (R26.89)     Time: 6578-4696 PT Time Calculation (min) (ACUTE ONLY): 9  min  Charges:  $Gait Training: 8-22 mins                    Arlyce Dice, DPT Acute Rehabilitation Services Pager: [phone] Office: [phone]   York Ram E 09/25/2019, 2:29 PM

## 2019-09-25 NOTE — Discharge Summary (Signed)
Patient ID: Jasmine Estrada MRN: [id number] DOB/AGE: 1955-12-28 64 y.o.  Admit date: 09/24/2019 Discharge date: 09/25/2019  Admission Diagnoses:  Principal Problem:   Right knee OA Active Problems:   S/P right TKA   Obese   Discharge Diagnoses:  Same  Past Medical History:  Diagnosis Date  . Arthritis    legs  . Asthma    prn inhaler  . Cough 07/15/2012  . Dyspnea   . Essential and other specified forms of tremor 01/26/2013  . Full dentures   . Mass of knee 07/2012   left  . Memory difficulty 01/26/2013  . Obesity   . Overactive bladder 2019  . Sleep apnea   . Stroke [facility])    right sided-weakness  . Tremor    right foot  . Urinary frequency     Surgeries: Procedure(s):  RIGHT TOTAL KNEE ARTHROPLASTY on 09/24/2019   Consultants: N/A  Discharged Condition: Improved  Hospital Course: Jasmine Estrada is an 64 y.o. female who was admitted 09/24/2019 for operative treatment ofOsteoarthritis of right knee. Patient has severe unremitting pain that affects sleep, daily activities, and work/hobbies. After pre-op clearance the patient was taken to the operating room on 09/24/2019 and underwent  Procedure(s): RIGHT TOTAL KNEE ARTHROPLASTY.    Patient was given perioperative antibiotics:  Anti-infectives (From admission, onward)   Start     Dose/Rate Route Frequency Ordered Stop   09/24/19 1330  ceFAZolin (ANCEF) IVPB 2g/100 mL premix        2 g 200 mL/hr over 30 Minutes Intravenous Every 6 hours 09/24/19 1043 09/24/19 2046   09/24/19 1045  terbinafine (LAMISIL) tablet 250 mg     Discontinue     250 mg Oral Daily 09/24/19 1043     09/24/19 0600  ceFAZolin (ANCEF) IVPB 2g/100 mL premix        2 g 200 mL/hr over 30 Minutes Intravenous On call to O.R. 09/24/19 0531 09/24/19 0754       Patient was given sequential compression devices, early ambulation, and chemoprophylaxis to prevent DVT.  Patient benefited maximally from hospital stay and there were no complications.     Recent vital signs:  Patient Vitals for the past 24 hrs:  BP Temp Temp src Pulse Resp SpO2  09/25/19 0938 126/74 97.7 F (36.5 C) Oral 62 14 100 %  09/25/19 0737 -- -- -- -- -- 96 %  09/25/19 0458 117/63 98.4 F (36.9 C) -- (!) 56 16 99 %  09/25/19 0143 118/61 98 F (36.7 C) -- (!) 56 15 96 %  09/24/19 2249 114/70 97.6 F (36.4 C) -- 63 15 98 %  09/24/19 1957 -- -- -- -- -- 98 %  09/24/19 1853 128/68 98 F (36.7 C) Oral 64 18 99 %  09/24/19 1422 120/68 97.9 F (36.6 C) Oral (!) 54 15 100 %  09/24/19 1247 123/78 98.5 F (36.9 C) Oral (!) 59 12 100 %  09/24/19 1144 118/84 97.7 F (36.5 C) Oral (!) 47 15 100 %  09/24/19 1050 124/87 (!) 96.9 F (36.1 C) Axillary (!) 48 15 99 %  09/24/19 1031 -- -- -- (!) 59 19 98 %  09/24/19 1030 -- 97.6 F (36.4 C) -- -- -- --  09/24/19 1029 -- -- -- (!) 58 18 100 %  09/24/19 1028 -- -- -- (!) 46 12 100 %  09/24/19 1027 -- -- -- (!) 46 13 100 %  09/24/19 1026 -- -- -- (!) 46 15 100 %  09/24/19 1025 -- -- -- (!) 44 10 100 %  09/24/19 1024 -- -- -- (!) 46 10 99 %  09/24/19 1023 -- -- -- (!) 48 11 100 %  09/24/19 1022 -- -- -- (!) 46 13 100 %  09/24/19 1021 -- -- -- (!) 44 13 100 %  09/24/19 1020 -- -- -- (!) 44 12 100 %  09/24/19 1019 -- -- -- (!) 49 15 100 %  09/24/19 1018 -- -- -- (!) 45 (!) 9 100 %  09/24/19 1017 -- -- -- (!) 54 18 100 %  09/24/19 1016 -- -- -- (!) 45 11 100 %  09/24/19 1015 133/74 -- -- (!) 48 13 100 %  09/24/19 1014 -- -- -- (!) 46 15 100 %  09/24/19 1013 -- -- -- (!) 47 12 100 %  09/24/19 1012 -- -- -- (!) 47 13 100 %  09/24/19 1011 -- -- -- (!) 46 14 100 %  09/24/19 1010 -- -- -- (!) 47 10 100 %  09/24/19 1009 -- -- -- (!) 45 15 100 %  09/24/19 1008 -- -- -- (!) 48 16 100 %  09/24/19 1007 -- -- -- (!) 45 16 100 %  09/24/19 1006 -- -- -- (!) 49 16 100 %     Recent laboratory studies:  Recent Labs    09/25/19 0330  WBC 10.4  HGB 10.5*  HCT 32.3*  PLT 154  NA 140  K 4.4  CL 109  CO2 27  BUN 13   CREATININE 0.68  GLUCOSE 109*  CALCIUM 9.6     Discharge Medications:   Allergies as of 09/25/2019   No Known Allergies     Medication List    STOP taking these medications   BC HEADACHE POWDER PO   diclofenac Sodium 1 % Gel Commonly known as: VOLTAREN   GOODY HEADACHE PO   HYDROcodone-acetaminophen 5-325 MG tablet Commonly known as: NORCO/VICODIN Replaced by: HYDROcodone-acetaminophen 7.5-325 MG tablet   meloxicam 7.5 MG tablet Commonly known as: MOBIC   predniSONE 5 MG (21) Tbpk tablet Commonly known as: STERAPRED UNI-PAK 21 TAB     TAKE these medications   albuterol 108 (90 Base) MCG/ACT inhaler Commonly known as: VENTOLIN HFA Inhale 2 puffs into the lungs every 6 (six) hours as needed for wheezing.   atorvastatin 40 MG tablet Commonly known as: LIPITOR Take 40 mg by mouth daily.   buPROPion 150 MG 12 hr tablet Commonly known as: WELLBUTRIN SR Take 150 mg by mouth 2 (two) times daily.   busPIRone 10 MG tablet Commonly known as: BUSPAR Take 10 mg by mouth 2 (two) times daily.   docusate sodium 100 MG capsule Commonly known as: Colace Take 1 capsule (100 mg total) by mouth 2 (two) times daily.   ferrous sulfate 325 (65 FE) MG tablet Commonly known as: FerrouSul Take 1 tablet (325 mg total) by mouth 3 (three) times daily with meals for 14 days.   Flovent HFA 220 MCG/ACT inhaler Generic drug: fluticasone Inhale 2 puffs into the lungs in the morning and at bedtime.   HYDROcodone-acetaminophen 7.5-325 MG tablet Commonly known as: Norco Take 1-2 tablets by mouth every 4 (four) hours as needed for moderate pain. Replaces: HYDROcodone-acetaminophen 5-325 MG tablet   methocarbamol 500 MG tablet Commonly known as: Robaxin Take 1 tablet (500 mg total) by mouth every 6 (six) hours as needed for muscle spasms.   Neupro 1 MG/24HR Pt24 Generic drug: Rotigotine Apply 1 patch topically  daily.   oxybutynin 5 MG tablet Commonly known as: DITROPAN Take 5 mg  by mouth 2 (two) times daily.   polyethylene glycol 17 g packet Commonly known as: MIRALAX / GLYCOLAX Take 17 g by mouth 2 (two) times daily.   primidone 50 MG tablet Commonly known as: MYSOLINE Take 25 mg by mouth at bedtime.   rivaroxaban 10 MG Tabs tablet Commonly known as: Xarelto Take 1 tablet (10 mg total) by mouth daily.   ropinirole 5 MG tablet Commonly known as: REQUIP TAKE 1 TABLET BY MOUTH EVERYDAY AT BEDTIME What changed: See the new instructions.   Symbicort 160-4.5 MCG/ACT inhaler Generic drug: budesonide-formoterol Inhale 2 puffs into the lungs in the morning and at bedtime.   terbinafine 250 MG tablet Commonly known as: LamISIL Take 1 tablet (250 mg total) by mouth daily.            Discharge Care Instructions  (From admission, onward)         Start     Ordered   09/25/19 0000  Change dressing       Comments: Maintain surgical dressing until follow up in the clinic. If the edges start to pull up, may reinforce with tape. If the dressing is no longer working, may remove and cover with gauze and tape, but must keep the area dry and clean.  Call with any questions or concerns.   09/25/19 0934          Diagnostic Studies: No results found.  Disposition: Discharge disposition: 01-Home or Self Care       Discharge Instructions    Call MD / Call 911   Complete by: As directed    If you experience chest pain or shortness of breath, CALL 911 and be transported to the hospital emergency room.  If you develope a fever above 101 F, pus (white drainage) or increased drainage or redness at the wound, or calf pain, call your surgeon's office.   Change dressing   Complete by: As directed    Maintain surgical dressing until follow up in the clinic. If the edges start to pull up, may reinforce with tape. If the dressing is no longer working, may remove and cover with gauze and tape, but must keep the area dry and clean.  Call with any questions or concerns.    Constipation Prevention   Complete by: As directed    Drink plenty of fluids.  Prune juice may be helpful.  You may use a stool softener, such as Colace (over the counter) 100 mg twice a day.  Use MiraLax (over the counter) for constipation as needed.   Diet - low sodium heart healthy   Complete by: As directed    Discharge instructions   Complete by: As directed    Maintain surgical dressing until follow up in the clinic. If the edges start to pull up, may reinforce with tape. If the dressing is no longer working, may remove and cover with gauze and tape, but must keep the area dry and clean.  Follow up in 2 weeks at [facility]. Call with any questions or concerns.   Increase activity slowly as tolerated   Complete by: As directed    Weight bearing as tolerated with assist device (walker, cane, etc) as directed, use it as long as suggested by your surgeon or therapist, typically at least 4-6 weeks.   TED hose   Complete by: As directed    Use stockings (TED hose) for 2  weeks on both leg(s).  You may remove them at night for sleeping.       Follow-up Information    Paralee Cancel, MD. Schedule an appointment as soon as possible for a visit in 2 weeks.   Specialty: Orthopedic Surgery Contact information: [address] [place] [address] [phone]                Signed: Lucille Passy [facility] 09/25/2019, 10:05 AM

## 2019-09-27 ENCOUNTER — Other Ambulatory Visit: Payer: Self-pay | Admitting: Sports Medicine

## 2019-09-27 DIAGNOSIS — G2581 Restless legs syndrome: Secondary | ICD-10-CM

## 2019-09-27 DIAGNOSIS — R251 Tremor, unspecified: Secondary | ICD-10-CM

## 2019-10-23 ENCOUNTER — Other Ambulatory Visit: Payer: Self-pay

## 2019-10-23 ENCOUNTER — Other Ambulatory Visit (HOSPITAL_COMMUNITY): Payer: Self-pay | Admitting: Orthopedic Surgery

## 2019-10-23 ENCOUNTER — Ambulatory Visit (HOSPITAL_COMMUNITY)
Admission: RE | Admit: 2019-10-23 | Discharge: 2019-10-23 | Disposition: A | Discharge status: Home / Self Care | Payer: Medicare HMO | Source: Ambulatory Visit | Attending: Cardiovascular Disease | Admitting: Cardiovascular Disease

## 2019-10-23 DIAGNOSIS — M79661 Pain in right lower leg: Secondary | ICD-10-CM

## 2019-10-23 DIAGNOSIS — M7989 Other specified soft tissue disorders: Secondary | ICD-10-CM | POA: Diagnosis present

## 2019-10-25 ENCOUNTER — Other Ambulatory Visit: Payer: Self-pay | Admitting: Sports Medicine

## 2019-10-25 DIAGNOSIS — R251 Tremor, unspecified: Secondary | ICD-10-CM

## 2019-10-25 DIAGNOSIS — G2581 Restless legs syndrome: Secondary | ICD-10-CM

## 2019-11-17 ENCOUNTER — Other Ambulatory Visit: Payer: Self-pay | Admitting: Sports Medicine

## 2019-11-17 DIAGNOSIS — R251 Tremor, unspecified: Secondary | ICD-10-CM

## 2019-11-17 DIAGNOSIS — G2581 Restless legs syndrome: Secondary | ICD-10-CM

## 2019-11-17 NOTE — Telephone Encounter (Signed)
This not something we normally refill for patient. Please advise. Thanks

## 2019-12-10 ENCOUNTER — Ambulatory Visit: Payer: Medicare HMO | Admitting: Sports Medicine

## 2019-12-13 ENCOUNTER — Other Ambulatory Visit: Payer: Self-pay | Admitting: Sports Medicine

## 2019-12-13 DIAGNOSIS — R251 Tremor, unspecified: Secondary | ICD-10-CM

## 2019-12-13 DIAGNOSIS — G2581 Restless legs syndrome: Secondary | ICD-10-CM

## 2019-12-13 NOTE — Telephone Encounter (Signed)
Please Advise

## 2020-01-12 ENCOUNTER — Other Ambulatory Visit: Payer: Self-pay | Admitting: Family

## 2020-01-12 ENCOUNTER — Other Ambulatory Visit: Payer: Self-pay | Admitting: Sports Medicine

## 2020-01-12 DIAGNOSIS — G2581 Restless legs syndrome: Secondary | ICD-10-CM

## 2020-01-12 DIAGNOSIS — Z1231 Encounter for screening mammogram for malignant neoplasm of breast: Secondary | ICD-10-CM

## 2020-01-12 DIAGNOSIS — R251 Tremor, unspecified: Secondary | ICD-10-CM

## 2020-01-12 NOTE — Telephone Encounter (Signed)
Please Advise

## 2020-01-14 ENCOUNTER — Ambulatory Visit (INDEPENDENT_AMBULATORY_CARE_PROVIDER_SITE_OTHER): Payer: Medicare HMO | Admitting: Sports Medicine

## 2020-01-14 ENCOUNTER — Encounter: Payer: Self-pay | Admitting: Sports Medicine

## 2020-01-14 ENCOUNTER — Other Ambulatory Visit: Payer: Self-pay

## 2020-01-14 DIAGNOSIS — B351 Tinea unguium: Secondary | ICD-10-CM

## 2020-01-14 DIAGNOSIS — M79672 Pain in left foot: Secondary | ICD-10-CM

## 2020-01-14 DIAGNOSIS — R251 Tremor, unspecified: Secondary | ICD-10-CM | POA: Diagnosis not present

## 2020-01-14 DIAGNOSIS — M79671 Pain in right foot: Secondary | ICD-10-CM | POA: Diagnosis not present

## 2020-01-14 NOTE — Progress Notes (Signed)
Subjective: Jasmine Estrada is a 64 y.o. female patient seen today in office for follow up evaluation of nail fungus completed Lamisil. Patient states that she is doing well. Patient reports that she her pcp did not put her back on Requip for restless leg and twitching, but otherwise has no other pedal complaints at this time.   Patient Active Problem List   Diagnosis Date Noted  . Obese 09/25/2019  . Right knee OA 09/25/2019  . S/P right TKA 09/24/2019  . Ankle edema 04/12/2015  . Status post right foot surgery 03/02/2015  . Equinus deformity of foot, acquired 02/02/2015  . Tenosynovitis of ankle 01/03/2015  . Posterior tibial tendon dysfunction 01/03/2015  . Onychomycosis 01/03/2015  . Urge urinary incontinence 11/11/2014  . Hematuria, microscopic 09/23/2014  . OAB (overactive bladder) 09/23/2014  . Lumbago 08/12/2013  . Pain in limb 05/25/2013  . Essential and other specified forms of tremor 01/26/2013  . Memory difficulty 01/26/2013    Current Outpatient Medications on File Prior to Visit  Medication Sig Dispense Refill  . albuterol (PROVENTIL HFA;VENTOLIN HFA) 108 (90 BASE) MCG/ACT inhaler Inhale 2 puffs into the lungs every 6 (six) hours as needed for wheezing.    Marland Kitchen atorvastatin (LIPITOR) 40 MG tablet Take 40 mg by mouth daily.     Marland Kitchen buPROPion (WELLBUTRIN SR) 150 MG 12 hr tablet Take 150 mg by mouth 2 (two) times daily.     . busPIRone (BUSPAR) 10 MG tablet Take 10 mg by mouth 2 (two) times daily.     Marland Kitchen docusate sodium (COLACE) 100 MG capsule Take 1 capsule (100 mg total) by mouth 2 (two) times daily. 28 capsule 0  . ferrous sulfate (FERROUSUL) 325 (65 FE) MG tablet Take 1 tablet (325 mg total) by mouth 3 (three) times daily with meals for 14 days. 42 tablet 0  . FLOVENT HFA 220 MCG/ACT inhaler Inhale 2 puffs into the lungs in the morning and at bedtime.     Marland Kitchen HYDROcodone-acetaminophen (NORCO) 7.5-325 MG tablet Take 1-2 tablets by mouth every 4 (four) hours as needed for moderate  pain. 60 tablet 0  . methocarbamol (ROBAXIN) 500 MG tablet Take 1 tablet (500 mg total) by mouth every 6 (six) hours as needed for muscle spasms. 40 tablet 0  . NEUPRO 1 MG/24HR PT24 Apply 1 patch topically daily.     Marland Kitchen oxybutynin (DITROPAN) 5 MG tablet Take 5 mg by mouth 2 (two) times daily.    . polyethylene glycol (MIRALAX / GLYCOLAX) 17 g packet Take 17 g by mouth 2 (two) times daily. 28 packet 0  . primidone (MYSOLINE) 50 MG tablet Take 25 mg by mouth at bedtime.     . rivaroxaban (XARELTO) 10 MG TABS tablet Take 1 tablet (10 mg total) by mouth daily. 14 tablet 0  . ropinirole (REQUIP) 5 MG tablet TAKE 1 TABLET BY MOUTH EVERYDAY AT BEDTIME 30 tablet 0  . SYMBICORT 160-4.5 MCG/ACT inhaler Inhale 2 puffs into the lungs in the morning and at bedtime.     . terbinafine (LAMISIL) 250 MG tablet Take 1 tablet (250 mg total) by mouth daily. 90 tablet 0   No current facility-administered medications on file prior to visit.    No Known Allergies  Objective: Physical Exam  General: Well developed, nourished, no acute distress, awake, alert and oriented x 3  Vascular: Dorsalis pedis artery 2/4 bilateral, Posterior tibial artery1/4 bilateral, skin temperature warm to warm proximal to distal bilateral lower extremities, no varicosities,  pedal hair present bilateral.  Neurological: Gross sensation present via light touch bilateral.Uncontrolled twitches of all toes as previously noted.   Dermatological: Skin is warm, dry, and supple bilateral, Nails 1-10 are tender, short thick, and discolored with mild subungal debris, no webspace macerations present bilateral, no open lesions present bilateral, minimal callus noted to left 1st toe. No signs of infection bilateral.  Musculoskeletal:+ Pes planus and hammertoe and bunionboney deformities noted bilateral. Muscular strength 4/5without painon range of motion. No pain with calf compression bilateral.  Assessment and Plan:  Problem List Items  Addressed This Visit      Musculoskeletal and Integument   Onychomycosis - Primary    Other Visit Diagnoses    Tremor       Bilateral foot pain          -Examined patient -At no charge debrided nails x 10 using sterile nail nipper without incident and callus at left 1st toe using rotary bur -Lamisil completed  -Advised good hygiene habits and educated patient on proper foot care to prevent re-infection -Patient to return in for nail care as needed   Landis Martins, DPM

## 2020-02-10 ENCOUNTER — Other Ambulatory Visit: Payer: Self-pay | Admitting: Sports Medicine

## 2020-02-10 DIAGNOSIS — R251 Tremor, unspecified: Secondary | ICD-10-CM

## 2020-02-10 DIAGNOSIS — G2581 Restless legs syndrome: Secondary | ICD-10-CM

## 2020-02-11 NOTE — Telephone Encounter (Signed)
Please advise 

## 2020-02-14 ENCOUNTER — Encounter (HOSPITAL_COMMUNITY): Payer: Self-pay | Admitting: Emergency Medicine

## 2020-02-14 ENCOUNTER — Ambulatory Visit (HOSPITAL_COMMUNITY)
Admission: EM | Admit: 2020-02-14 | Discharge: 2020-02-14 | Disposition: A | Discharge status: Home / Self Care | Payer: Medicare HMO | Attending: Family Medicine | Admitting: Family Medicine

## 2020-02-14 ENCOUNTER — Other Ambulatory Visit: Payer: Self-pay

## 2020-02-14 DIAGNOSIS — F17210 Nicotine dependence, cigarettes, uncomplicated: Secondary | ICD-10-CM | POA: Diagnosis not present

## 2020-02-14 DIAGNOSIS — R197 Diarrhea, unspecified: Secondary | ICD-10-CM

## 2020-02-14 DIAGNOSIS — Z20822 Contact with and (suspected) exposure to covid-19: Secondary | ICD-10-CM | POA: Insufficient documentation

## 2020-02-14 DIAGNOSIS — E860 Dehydration: Secondary | ICD-10-CM | POA: Insufficient documentation

## 2020-02-14 DIAGNOSIS — R110 Nausea: Secondary | ICD-10-CM | POA: Diagnosis not present

## 2020-02-14 DIAGNOSIS — R519 Headache, unspecified: Secondary | ICD-10-CM | POA: Diagnosis not present

## 2020-02-14 LAB — SARS CORONAVIRUS 2 (TAT 6-24 HRS): SARS Coronavirus 2: NEGATIVE

## 2020-02-14 MED ORDER — ONDANSETRON HCL 4 MG PO TABS: 4.0000 mg | ORAL_TABLET | Freq: Four times a day (QID) | ORAL | 0 refills | Status: DC

## 2020-02-14 MED ORDER — KETOROLAC TROMETHAMINE 30 MG/ML IJ SOLN
30.0000 mg | Freq: Once | INTRAMUSCULAR | Status: AC
  Administered 2020-02-14: 30 mg via INTRAVENOUS

## 2020-02-14 MED ORDER — TIZANIDINE HCL 2 MG PO TABS: 4.0000 mg | ORAL_TABLET | Freq: Four times a day (QID) | ORAL | 0 refills | Status: DC | PRN

## 2020-02-14 MED ORDER — METOCLOPRAMIDE HCL 5 MG/ML IJ SOLN
5.0000 mg | Freq: Once | INTRAMUSCULAR | Status: AC
  Administered 2020-02-14: 5 mg via INTRAVENOUS

## 2020-02-14 MED ORDER — KETOROLAC TROMETHAMINE 30 MG/ML IJ SOLN: 30.0000 mg | Freq: Once | INTRAMUSCULAR | Status: DC

## 2020-02-14 MED ORDER — METOCLOPRAMIDE HCL 5 MG/ML IJ SOLN
INTRAMUSCULAR | Status: AC
  Filled 2020-02-14: qty 2

## 2020-02-14 MED ORDER — KETOROLAC TROMETHAMINE 30 MG/ML IJ SOLN
INTRAMUSCULAR | Status: AC
  Filled 2020-02-14: qty 1

## 2020-02-14 MED ORDER — SODIUM CHLORIDE 0.9 % IV BOLUS
500.0000 mL | Freq: Once | INTRAVENOUS | Status: AC
  Administered 2020-02-14: 500 mL via INTRAVENOUS

## 2020-02-14 NOTE — Discharge Instructions (Addendum)
Prescribe Zofran for you to take as needed for nausea. For headache related pain prescribed tizanidine however avoid driving while taking medication as the medication can produce drowsiness.  Encourage hydration with water to prevent dehydration. If symptoms worsen or you experience any severe abdominal pain or you continue to be unable to tolerate food and liquids with antinausea medication recommend further evaluation and work-up in the setting of the emergency department.  If headache pain worsens or you develop dizziness or weakness I would recommend immediate follow-up at the emergency department. For diarrhea you can purchase over-the-counter Imodium A-D however take as directed do not take more than what is prescribed.

## 2020-02-14 NOTE — ED Triage Notes (Signed)
10/29 started feeling bad.  Complains of headache and vomiting and diarrhea (12 bowel movements yesterday)no bm today.  Patient has vomited 2 times today  Patient had work done in her home and references fumes as being overwhelming.  It was after exposure to this that patient reports she started feeling bad.

## 2020-02-14 NOTE — ED Notes (Signed)
covid swab remains at bedside

## 2020-02-14 NOTE — ED Provider Notes (Addendum)
[place]    CSN: [id number] Arrival date & time: 02/14/20  1002      History   Chief Complaint Chief Complaint  Patient presents with  . Headache  . Vomiting    HPI Jasmine Estrada is a 64 y.o. female.   HPI  Patient presents with an acute headache, nausea, loose stools following housework performed in her home.  Patient reports that she lives in a retirement facility and recently had some work done in her bathroom in which the work required use of fumes that immediately caused patient to experience a headache, nausea and loose stools.  She denies any abdominal pain however endorses nausea with ingestion of food.  She is attempted to eat crackers and drink ginger ale which worsened nausea and caused her to experience vomitus.  She reports the vomitus is clear and stools are watery.  She has taken Aleve to try and relieve her headache which has been ineffective.  Her blood pressure is low compared to her normal readings on arrival here today.  She denies any dizziness or generalized weakness.  No known sick exposures.  Past Medical History:  Diagnosis Date  . Arthritis    legs  . Asthma    prn inhaler  . Cough 07/15/2012  . Dyspnea   . Essential and other specified forms of tremor 01/26/2013  . Full dentures   . Mass of knee 07/2012   left  . Memory difficulty 01/26/2013  . Obesity   . Overactive bladder 2019  . Sleep apnea   . Stroke [facility])    right sided-weakness  . Tremor    right foot  . Urinary frequency     Patient Active Problem List   Diagnosis Date Noted  . Obese 09/25/2019  . Right knee OA 09/25/2019  . S/P right TKA 09/24/2019  . Pain in left knee 07/29/2019  . Pain in right knee 07/29/2019  . Ankle edema 04/12/2015  . Status post right foot surgery 03/02/2015  . Equinus deformity of foot, acquired 02/02/2015  . Tenosynovitis of ankle 01/03/2015  . Posterior tibial tendon dysfunction 01/03/2015  . Onychomycosis 01/03/2015  . Urge urinary  incontinence 11/11/2014  . Hematuria, microscopic 09/23/2014  . OAB (overactive bladder) 09/23/2014  . Lumbago 08/12/2013  . Pain in limb 05/25/2013  . Essential and other specified forms of tremor 01/26/2013  . Memory difficulty 01/26/2013    Past Surgical History:  Procedure Laterality Date  . ACHILLES TENDON LENGTHENING Right 02/25/2015  . BOTOX INJECTION N/A 11/19/2017   Procedure: CYSTOSCOPY BOTOX INJECTION;  Surgeon: Bjorn Loser, MD;  Location: [facility];  Service: Urology;  Laterality: N/A;  . MASS EXCISION Left 07/21/2012   Procedure: EXCISION OF LARGE MASS LEFT KNEE WITH LIPOSUCTION ASSISTED;  Surgeon: Cristine Polio, MD;  Location: [place];  Service: Plastics;  Laterality: Left;  . TOTAL KNEE ARTHROPLASTY Right 09/24/2019   Procedure: TOTAL KNEE ARTHROPLASTY;  Surgeon: Paralee Cancel, MD;  Location: [facility];  Service: Orthopedics;  Laterality: Right;  77mins  . TUBAL LIGATION  1982    OB History   No obstetric history on file.      Home Medications    Prior to Admission medications   Medication Sig Start Date End Date Taking? Authorizing Provider  albuterol (PROVENTIL HFA;VENTOLIN HFA) 108 (90 BASE) MCG/ACT inhaler Inhale 2 puffs into the lungs every 6 (six) hours as needed for wheezing.    [provider]  aspirin 81 MG  chewable tablet aspirin 81 mg chewable tablet  CHEW 1 TABLET TWICE A DAY BY ORAL ROUTE FOR 30 DAYS.    [provider]  atorvastatin (LIPITOR) 40 MG tablet Take 40 mg by mouth daily.  05/10/19   [provider]  buPROPion (WELLBUTRIN SR) 150 MG 12 hr tablet Take 150 mg by mouth 2 (two) times daily.  08/06/19   [provider]  busPIRone (BUSPAR) 10 MG tablet Take 10 mg by mouth 2 (two) times daily.  05/08/19   [provider]  docusate sodium (COLACE) 100 MG capsule Take 1 capsule (100 mg total) by mouth 2 (two) times daily. 09/25/19   Danae Orleans, PA-C  ferrous sulfate  (FERROUSUL) 325 (65 FE) MG tablet Take 1 tablet (325 mg total) by mouth 3 (three) times daily with meals for 14 days. 09/25/19 10/09/19  Danae Orleans, PA-C  FLOVENT HFA 220 MCG/ACT inhaler Inhale 2 puffs into the lungs in the morning and at bedtime.  04/05/19   [provider]  furosemide (LASIX) 20 MG tablet  11/23/19   [provider]  HYDROcodone-acetaminophen (NORCO) 7.5-325 MG tablet Take 1-2 tablets by mouth every 4 (four) hours as needed for moderate pain. 09/25/19   Danae Orleans, PA-C  meloxicam (MOBIC) 7.5 MG tablet  11/22/19   [provider]  methocarbamol (ROBAXIN) 500 MG tablet Take 1 tablet (500 mg total) by mouth every 6 (six) hours as needed for muscle spasms. 09/25/19   Danae Orleans, PA-C  MOVANTIK 25 MG TABS tablet  09/17/19   [provider]  NEUPRO 1 MG/24HR PT24 Apply 1 patch topically daily.  08/28/19   [provider]  oxybutynin (DITROPAN XL) 15 MG 24 hr tablet  11/20/19   [provider]  oxybutynin (DITROPAN) 5 MG tablet Take 5 mg by mouth 2 (two) times daily.    [provider]  polyethylene glycol (MIRALAX / GLYCOLAX) 17 g packet Take 17 g by mouth 2 (two) times daily. 09/25/19   Danae Orleans, PA-C  predniSONE (DELTASONE) 5 MG tablet prednisone 5 mg tablets in a dose pack    [provider]  primidone (MYSOLINE) 50 MG tablet Take 25 mg by mouth at bedtime.  04/15/19   [provider]  rivaroxaban (XARELTO) 10 MG TABS tablet Take 1 tablet (10 mg total) by mouth daily. 09/25/19   Danae Orleans, PA-C  ropinirole (REQUIP) 5 MG tablet TAKE 1 TABLET BY MOUTH EVERYDAY AT BEDTIME 02/11/20   Landis Martins, DPM  SYMBICORT 160-4.5 MCG/ACT inhaler Inhale 2 puffs into the lungs in the morning and at bedtime.  06/17/19   [provider]  terbinafine (LAMISIL) 250 MG tablet Take 1 tablet (250 mg total) by mouth daily. 06/16/19   Landis Martins, DPM  traZODone (DESYREL) 50 MG tablet trazodone 50 mg  tablet    [provider]    Family History Family History  Problem Relation Age of Onset  . Cancer Sister   . Diabetes Sister   . Mental illness Brother   . Alzheimer's disease Maternal Aunt   . Alzheimer's disease Maternal Grandmother   . Mental illness Sister     Social History Social History   Tobacco Use  . Smoking status: Current Every Day Smoker    Packs/day: 0.50    Years: 44.00    Pack years: 22.00    Types: Cigarettes  . Smokeless tobacco: Never Used  . Tobacco comment: 1 pack every 4 days  Vaping Use  .  Vaping Use: Never used  Substance Use Topics  . Alcohol use: No    Alcohol/week: 0.0 standard drinks  . Drug use: No     Allergies   Patient has no known allergies. Review of Systems Review of Systems Pertinent negatives listed in HPI  Physical Exam Triage Vital Signs ED Triage Vitals  Enc Vitals Group     BP 02/14/20 1022 103/68     Pulse Rate 02/14/20 1022 96     Resp 02/14/20 1022 20     Temp 02/14/20 1022 99 F (37.2 C)     Temp Source 02/14/20 1022 Oral     SpO2 02/14/20 1022 100 %     Weight --      Height --      Head Circumference --      Peak Flow --      Pain Score 02/14/20 1019 8     Pain Loc --      Pain Edu? --      Excl. in [place]? --    No data found.  Updated Vital Signs BP 103/68 (BP Location: Left Arm)   Pulse 96   Temp 99 F (37.2 C) (Oral)   Resp 20   SpO2 100%   Visual Acuity Right Eye Distance:   Left Eye Distance:   Bilateral Distance:    Right Eye Near:   Left Eye Near:    Bilateral Near:     Physical Exam Vitals reviewed.  Constitutional:      Comments: Chronically ill appearing   Cardiovascular:     Rate and Rhythm: Normal rate and regular rhythm.  Pulmonary:     Effort: Pulmonary effort is normal.     Breath sounds: Normal breath sounds.  Abdominal:     Palpations: Abdomen is soft.     Comments: Decreased bowel sounds  Musculoskeletal:     Cervical back: Normal range of motion and  neck supple.  Neurological:     Mental Status: She is alert.     GCS: GCS eye subscore is 4. GCS verbal subscore is 5. GCS motor subscore is 6.  Psychiatric:        Mood and Affect: Mood normal.        Speech: Speech normal.        Behavior: Behavior normal.      UC Treatments / Results  Labs (all labs ordered are listed, but only abnormal results are displayed) Labs Reviewed  SARS CORONAVIRUS 2 (TAT 6-24 HRS)    EKG   Radiology No results found.  Procedures Procedures (including critical care time)  Medications Ordered in UC Medications  sodium chloride 0.9 % bolus 500 mL (has no administration in time range)  metoCLOPramide (REGLAN) injection 5 mg (has no administration in time range)  ketorolac (TORADOL) 30 MG/ML injection 30 mg (has no administration in time range)    Initial Impression / Assessment and Plan / UC Course  I have reviewed the triage vital signs and the nursing notes.  Pertinent labs & imaging results that were available during my care of the patient were reviewed by me and considered in my medical decision making (see chart for details).     Patient presents today with symptoms of a generalized headache x2 days, diarrhea and mild dehydration along with nausea with 2 episodes of vomitus.  Patient treated with IV rehydration with fluids and also antiemetic and Toradol for headache pain.  Patient endorsed improvement of headache with IV fluids and  is currently not experiencing any nausea.  Discharging home with strict follow-up precautions to go to the ER if any of her symptoms worsen or do not improve.  Blood pressure improved with fluid hydration.  Advised advance diet as tolerated however encourage fluid hydration to prevent dehydration.  Covid test pending to rule out viral etiology.  Patient verbalized understanding agreement plan. Final Clinical Impressions(s) / UC Diagnoses   Final diagnoses:  Generalized headache  Diarrhea, unspecified type  Mild  dehydration     Discharge Instructions     Prescribe Zofran for you to take as needed for nausea. For headache related pain prescribed tizanidine however avoid driving while taking medication as the medication can produce drowsiness.  Encourage hydration with water to prevent dehydration. If symptoms worsen or you experience any severe abdominal pain or you continue to be unable to tolerate food and liquids with antinausea medication recommend further evaluation and work-up in the setting of the emergency department.  If headache pain worsens or you develop dizziness or weakness I would recommend immediate follow-up at the emergency department. For diarrhea you can purchase over-the-counter Imodium A-D however take as directed do not take more than what is prescribed.    ED Prescriptions    Medication Sig Dispense Auth. Provider   ondansetron (ZOFRAN) 4 MG tablet Take 1 tablet (4 mg total) by mouth every 6 (six) hours. 12 tablet Scot Jun, FNP   tiZANidine (ZANAFLEX) 2 MG tablet Take 2 tablets (4 mg total) by mouth every 6 (six) hours as needed for muscle spasms. 14 tablet Scot Jun, FNP     PDMP not reviewed this encounter.   Scot Jun, FNP 02/14/20 [place], [place], FNP 02/14/20 1146

## 2020-02-22 ENCOUNTER — Other Ambulatory Visit: Payer: Self-pay

## 2020-02-22 ENCOUNTER — Ambulatory Visit
Admission: RE | Admit: 2020-02-22 | Discharge: 2020-02-22 | Disposition: A | Discharge status: Home / Self Care | Payer: Medicare HMO | Source: Ambulatory Visit | Attending: Family | Admitting: Family

## 2020-02-22 DIAGNOSIS — Z1231 Encounter for screening mammogram for malignant neoplasm of breast: Secondary | ICD-10-CM

## 2020-03-04 ENCOUNTER — Ambulatory Visit
Admission: RE | Admit: 2020-03-04 | Discharge: 2020-03-04 | Disposition: A | Discharge status: Home / Self Care | Payer: Medicare HMO | Source: Ambulatory Visit | Attending: Family | Admitting: Family

## 2020-03-04 ENCOUNTER — Other Ambulatory Visit: Payer: Self-pay | Admitting: Family

## 2020-03-04 DIAGNOSIS — G8911 Acute pain due to trauma: Secondary | ICD-10-CM

## 2020-03-06 ENCOUNTER — Other Ambulatory Visit: Payer: Self-pay | Admitting: Sports Medicine

## 2020-03-06 DIAGNOSIS — R251 Tremor, unspecified: Secondary | ICD-10-CM

## 2020-03-06 DIAGNOSIS — G2581 Restless legs syndrome: Secondary | ICD-10-CM

## 2020-03-07 NOTE — Telephone Encounter (Signed)
Please advise 

## 2020-03-13 ENCOUNTER — Ambulatory Visit (HOSPITAL_COMMUNITY)
Admission: EM | Admit: 2020-03-13 | Discharge: 2020-03-13 | Disposition: A | Discharge status: Home / Self Care | Payer: Medicare HMO | Attending: Family Medicine | Admitting: Family Medicine

## 2020-03-13 ENCOUNTER — Other Ambulatory Visit: Payer: Self-pay

## 2020-03-13 ENCOUNTER — Encounter (HOSPITAL_COMMUNITY): Payer: Self-pay | Admitting: Family Medicine

## 2020-03-13 DIAGNOSIS — R059 Cough, unspecified: Secondary | ICD-10-CM | POA: Diagnosis present

## 2020-03-13 DIAGNOSIS — Z20822 Contact with and (suspected) exposure to covid-19: Secondary | ICD-10-CM | POA: Insufficient documentation

## 2020-03-13 DIAGNOSIS — Z7982 Long term (current) use of aspirin: Secondary | ICD-10-CM | POA: Diagnosis not present

## 2020-03-13 DIAGNOSIS — Z7952 Long term (current) use of systemic steroids: Secondary | ICD-10-CM | POA: Insufficient documentation

## 2020-03-13 DIAGNOSIS — Z733 Stress, not elsewhere classified: Secondary | ICD-10-CM | POA: Diagnosis not present

## 2020-03-13 DIAGNOSIS — F17210 Nicotine dependence, cigarettes, uncomplicated: Secondary | ICD-10-CM | POA: Insufficient documentation

## 2020-03-13 DIAGNOSIS — Z791 Long term (current) use of non-steroidal anti-inflammatories (NSAID): Secondary | ICD-10-CM | POA: Insufficient documentation

## 2020-03-13 DIAGNOSIS — Z591 Inadequate housing: Secondary | ICD-10-CM | POA: Insufficient documentation

## 2020-03-13 DIAGNOSIS — Z79899 Other long term (current) drug therapy: Secondary | ICD-10-CM | POA: Diagnosis not present

## 2020-03-13 DIAGNOSIS — Z7901 Long term (current) use of anticoagulants: Secondary | ICD-10-CM | POA: Diagnosis not present

## 2020-03-13 DIAGNOSIS — J45909 Unspecified asthma, uncomplicated: Secondary | ICD-10-CM | POA: Diagnosis not present

## 2020-03-13 DIAGNOSIS — Z7951 Long term (current) use of inhaled steroids: Secondary | ICD-10-CM | POA: Diagnosis not present

## 2020-03-13 DIAGNOSIS — J069 Acute upper respiratory infection, unspecified: Secondary | ICD-10-CM | POA: Diagnosis not present

## 2020-03-13 LAB — RESP PANEL BY RT-PCR (RSV, FLU A&B, COVID)  RVPGX2
Influenza A by PCR: NEGATIVE
Influenza B by PCR: NEGATIVE
Resp Syncytial Virus by PCR: NEGATIVE
SARS Coronavirus 2 by RT PCR: NEGATIVE

## 2020-03-13 MED ORDER — HYDROCODONE-HOMATROPINE 5-1.5 MG/5ML PO SYRP
5.0000 mL | ORAL_SOLUTION | Freq: Four times a day (QID) | ORAL | 0 refills | Status: DC | PRN
Start: 2020-03-13 — End: 2021-03-20

## 2020-03-13 NOTE — ED Triage Notes (Signed)
C/O cough x 1 yr, but started with hoarseness and painful coughing 1 wk ago.  Denies any chest pain.  Denies fever.  C/O SOB x 1 wk.  C/O runny nose.

## 2020-03-13 NOTE — Discharge Instructions (Addendum)
Strategies to prevent and/or treat COVID-19:  Vitamin D3 5000 IU (125 mcg) daily Vitamin C 500 mg twice daily Zinc 50 to 75 mg daily   Listerine type mouthwash 4 times a day  It is important to have your bathroom repaired and cleaned.  The current condition will make your breathing worse.

## 2020-03-13 NOTE — ED Provider Notes (Signed)
[place]    CSN: [id number] Arrival date & time: 03/13/20  1002      History   Chief Complaint Chief Complaint  Patient presents with  . Cough    HPI Jasmine Estrada is a 64 y.o. female.   Established [facility] patient here for evaluation of chest pain and cough.  Risk factors for Covid involve asthma, smoking cigarettes, and obesity.  She lives in an apartment which needs repairs to the bathroom, with leakage and dirty area under sink.  Patient became ill a week ago.  She is using her inhalers, but the productive cough is keeping her awake at night.  She has not had shortness of breath or fever.  No hemoptysis.     Past Medical History:  Diagnosis Date  . Arthritis    legs  . Asthma    prn inhaler  . Cough 07/15/2012  . Dyspnea   . Essential and other specified forms of tremor 01/26/2013  . Full dentures   . Mass of knee 07/2012   left  . Memory difficulty 01/26/2013  . Obesity   . Overactive bladder 2019  . Sleep apnea   . Stroke [facility])    right sided-weakness  . Tremor    right foot  . Urinary frequency     Patient Active Problem List   Diagnosis Date Noted  . Obese 09/25/2019  . Right knee OA 09/25/2019  . S/P right TKA 09/24/2019  . Pain in left knee 07/29/2019  . Pain in right knee 07/29/2019  . Ankle edema 04/12/2015  . Status post right foot surgery 03/02/2015  . Equinus deformity of foot, acquired 02/02/2015  . Tenosynovitis of ankle 01/03/2015  . Posterior tibial tendon dysfunction 01/03/2015  . Onychomycosis 01/03/2015  . Urge urinary incontinence 11/11/2014  . Hematuria, microscopic 09/23/2014  . OAB (overactive bladder) 09/23/2014  . Lumbago 08/12/2013  . Pain in limb 05/25/2013  . Essential and other specified forms of tremor 01/26/2013  . Memory difficulty 01/26/2013    Past Surgical History:  Procedure Laterality Date  . ACHILLES TENDON LENGTHENING Right 02/25/2015  . BOTOX INJECTION N/A 11/19/2017   Procedure:  CYSTOSCOPY BOTOX INJECTION;  Surgeon: Bjorn Loser, MD;  Location: [facility];  Service: Urology;  Laterality: N/A;  . JOINT REPLACEMENT    . MASS EXCISION Left 07/21/2012   Procedure: EXCISION OF LARGE MASS LEFT KNEE WITH LIPOSUCTION ASSISTED;  Surgeon: Cristine Polio, MD;  Location: [place];  Service: Plastics;  Laterality: Left;  . TOTAL KNEE ARTHROPLASTY Right 09/24/2019   Procedure: TOTAL KNEE ARTHROPLASTY;  Surgeon: Paralee Cancel, MD;  Location: [facility];  Service: Orthopedics;  Laterality: Right;  12mins  . TUBAL LIGATION  1982    OB History   No obstetric history on file.      Home Medications    Prior to Admission medications   Medication Sig Start Date End Date Taking? Authorizing Provider  albuterol (PROVENTIL HFA;VENTOLIN HFA) 108 (90 BASE) MCG/ACT inhaler Inhale 2 puffs into the lungs every 6 (six) hours as needed for wheezing.   Yes [provider]  aspirin 81 MG chewable tablet aspirin 81 mg chewable tablet  CHEW 1 TABLET TWICE A DAY BY ORAL ROUTE FOR 30 DAYS.   Yes [provider]  atorvastatin (LIPITOR) 40 MG tablet Take 40 mg by mouth daily.  05/10/19  Yes [provider]  buPROPion (WELLBUTRIN SR) 150 MG 12 hr tablet Take 150 mg by mouth  2 (two) times daily.  08/06/19  Yes [provider]  FLOVENT HFA 220 MCG/ACT inhaler Inhale 2 puffs into the lungs in the morning and at bedtime.  04/05/19  Yes [provider]  HYDROcodone-acetaminophen (NORCO) 7.5-325 MG tablet Take 1-2 tablets by mouth every 4 (four) hours as needed for moderate pain. 09/25/19  Yes Babish, Rodman Key, PA-C  NEUPRO 1 MG/24HR PT24 Apply 1 patch topically daily.  08/28/19  Yes [provider]  oxybutynin (DITROPAN XL) 15 MG 24 hr tablet  11/20/19  Yes [provider]  primidone (MYSOLINE) 50 MG tablet Take 25 mg by mouth at bedtime.  04/15/19  Yes [provider]  SYMBICORT 160-4.5 MCG/ACT inhaler Inhale  2 puffs into the lungs in the morning and at bedtime.  06/17/19  Yes [provider]  traZODone (DESYREL) 50 MG tablet trazodone 50 mg tablet   Yes [provider]  busPIRone (BUSPAR) 10 MG tablet Take 10 mg by mouth 2 (two) times daily.  05/08/19   [provider]  docusate sodium (COLACE) 100 MG capsule Take 1 capsule (100 mg total) by mouth 2 (two) times daily. 09/25/19   Danae Orleans, PA-C  ferrous sulfate (FERROUSUL) 325 (65 FE) MG tablet Take 1 tablet (325 mg total) by mouth 3 (three) times daily with meals for 14 days. 09/25/19 10/09/19  Danae Orleans, PA-C  furosemide (LASIX) 20 MG tablet  11/23/19   [provider]  HYDROcodone-homatropine (HYDROMET) 5-1.5 MG/5ML syrup Take 5 mLs by mouth every 6 (six) hours as needed for cough. 03/13/20   Robyn Haber, MD  meloxicam (MOBIC) 7.5 MG tablet  11/22/19   [provider]  MOVANTIK 25 MG TABS tablet  09/17/19   [provider]  ondansetron (ZOFRAN) 4 MG tablet Take 1 tablet (4 mg total) by mouth every 6 (six) hours. 02/14/20   Scot Jun, FNP  oxybutynin (DITROPAN) 5 MG tablet Take 5 mg by mouth 2 (two) times daily.    [provider]  polyethylene glycol (MIRALAX / GLYCOLAX) 17 g packet Take 17 g by mouth 2 (two) times daily. 09/25/19   Danae Orleans, PA-C  predniSONE (DELTASONE) 5 MG tablet prednisone 5 mg tablets in a dose pack    [provider]  rivaroxaban (XARELTO) 10 MG TABS tablet Take 1 tablet (10 mg total) by mouth daily. 09/25/19   Danae Orleans, PA-C  ropinirole (REQUIP) 5 MG tablet TAKE 1 TABLET BY MOUTH EVERYDAY AT BEDTIME 03/07/20   Stover, Titorya, DPM  terbinafine (LAMISIL) 250 MG tablet Take 1 tablet (250 mg total) by mouth daily. 06/16/19   Landis Martins, DPM  tiZANidine (ZANAFLEX) 2 MG tablet Take 2 tablets (4 mg total) by mouth every 6 (six) hours as needed for muscle spasms. 02/14/20   Scot Jun, FNP    Family History Family History    Problem Relation Age of Onset  . Cancer Sister   . Diabetes Sister   . Mental illness Brother   . Alzheimer's disease Maternal Aunt   . Alzheimer's disease Maternal Grandmother   . Mental illness Sister     Social History Social History   Tobacco Use  . Smoking status: Current Every Day Smoker    Packs/day: 0.50    Years: 44.00    Pack years: 22.00    Types: Cigarettes  . Smokeless tobacco: Never Used  . Tobacco comment: 1 pack every 4 days  Vaping Use  . Vaping Use: Never used  Substance Use  Topics  . Alcohol use: Yes    Comment: rare  . Drug use: No     Allergies   Patient has no known allergies.   Review of Systems Review of Systems  Constitutional: Negative.   Respiratory: Positive for cough.      Physical Exam Triage Vital Signs ED Triage Vitals  Enc Vitals Group     BP      Pulse      Resp      Temp      Temp src      SpO2      Weight      Height      Head Circumference      Peak Flow      Pain Score      Pain Loc      Pain Edu?      Excl. in [place]?    No data found.  Updated Vital Signs BP (!) 150/74   Pulse 98   Temp 98.4 F (36.9 C) (Oral)   Resp (!) 28   SpO2 99%    Physical Exam Vitals and nursing note reviewed.  Constitutional:      General: She is not in acute distress.    Appearance: Normal appearance. She is obese. She is not ill-appearing.  HENT:     Head: Normocephalic.  Eyes:     Conjunctiva/sclera: Conjunctivae normal.  Cardiovascular:     Rate and Rhythm: Normal rate and regular rhythm.     Heart sounds: Normal heart sounds.  Pulmonary:     Effort: Pulmonary effort is normal.     Breath sounds: Wheezing present.     Comments: Expiratory wheezes bilaterally, anterior chest. Musculoskeletal:        General: Normal range of motion.     Cervical back: Normal range of motion and neck supple.  Skin:    General: Skin is warm and dry.  Neurological:     General: No focal deficit present.     Mental Status: She is  alert and oriented to person, place, and time.  Psychiatric:        Mood and Affect: Mood normal.        Thought Content: Thought content normal.      UC Treatments / Results  Labs (all labs ordered are listed, but only abnormal results are displayed) Labs Reviewed  RESP PANEL BY RT-PCR (RSV, FLU A&B, COVID)  [id number]    EKG   Radiology No results found.  Procedures Procedures (including critical care time)  Medications Ordered in UC Medications - No data to display  Initial Impression / Assessment and Plan / UC Course  I have reviewed the triage vital signs and the nursing notes.  Pertinent labs & imaging results that were available during my care of the patient were reviewed by me and considered in my medical decision making (see chart for details).     Final Clinical Impressions(s) / UC Diagnoses   Final diagnoses:  Viral URI with cough     Discharge Instructions     Strategies to prevent and/or treat COVID-19:  Vitamin D3 5000 IU (125 mcg) daily Vitamin C 500 mg twice daily Zinc 50 to 75 mg daily   Listerine type mouthwash 4 times a day  It is important to have your bathroom repaired and cleaned.  The current condition will make your breathing worse.       ED Prescriptions    Medication Sig Dispense Auth. Provider  HYDROcodone-homatropine (HYDROMET) 5-1.5 MG/5ML syrup Take 5 mLs by mouth every 6 (six) hours as needed for cough. 60 mL Robyn Haber, MD     I have reviewed the PDMP during this encounter.   Robyn Haber, MD 03/13/20 1037

## 2020-04-02 ENCOUNTER — Other Ambulatory Visit: Payer: Self-pay | Admitting: Sports Medicine

## 2020-04-02 DIAGNOSIS — G2581 Restless legs syndrome: Secondary | ICD-10-CM

## 2020-04-02 DIAGNOSIS — R251 Tremor, unspecified: Secondary | ICD-10-CM

## 2020-04-03 NOTE — Telephone Encounter (Signed)
Please advise 

## 2020-04-14 ENCOUNTER — Ambulatory Visit: Payer: Medicare HMO | Admitting: Sports Medicine

## 2020-04-21 ENCOUNTER — Ambulatory Visit: Payer: Medicare HMO | Admitting: Sports Medicine

## 2020-05-05 ENCOUNTER — Ambulatory Visit: Payer: Medicare HMO | Admitting: Sports Medicine

## 2020-05-06 ENCOUNTER — Other Ambulatory Visit: Payer: Self-pay | Admitting: Sports Medicine

## 2020-05-06 DIAGNOSIS — R251 Tremor, unspecified: Secondary | ICD-10-CM

## 2020-05-06 DIAGNOSIS — G2581 Restless legs syndrome: Secondary | ICD-10-CM

## 2020-05-19 ENCOUNTER — Ambulatory Visit (INDEPENDENT_AMBULATORY_CARE_PROVIDER_SITE_OTHER): Payer: Medicare HMO | Admitting: Sports Medicine

## 2020-05-19 ENCOUNTER — Encounter: Payer: Self-pay | Admitting: Sports Medicine

## 2020-05-19 ENCOUNTER — Other Ambulatory Visit: Payer: Self-pay

## 2020-05-19 DIAGNOSIS — M79672 Pain in left foot: Secondary | ICD-10-CM | POA: Diagnosis not present

## 2020-05-19 DIAGNOSIS — R251 Tremor, unspecified: Secondary | ICD-10-CM | POA: Diagnosis not present

## 2020-05-19 DIAGNOSIS — B351 Tinea unguium: Secondary | ICD-10-CM

## 2020-05-19 DIAGNOSIS — M79671 Pain in right foot: Secondary | ICD-10-CM

## 2020-05-19 NOTE — Progress Notes (Signed)
Subjective: Jasmine Estrada is a 65 y.o. female patient seen today in office for follow up evaluation and routine nail care.  Patient reports that her skin is dry constantly uses moisturizer but has not really seen much improvement.  Denies any other pedal complaints at this time. Patient Active Problem List   Diagnosis Date Noted  . Obese 09/25/2019  . Right knee OA 09/25/2019  . S/P right TKA 09/24/2019  . Pain in left knee 07/29/2019  . Pain in right knee 07/29/2019  . Ankle edema 04/12/2015  . Status post right foot surgery 03/02/2015  . Equinus deformity of foot, acquired 02/02/2015  . Tenosynovitis of ankle 01/03/2015  . Posterior tibial tendon dysfunction 01/03/2015  . Onychomycosis 01/03/2015  . Urge urinary incontinence 11/11/2014  . Hematuria, microscopic 09/23/2014  . OAB (overactive bladder) 09/23/2014  . Lumbago 08/12/2013  . Pain in limb 05/25/2013  . Essential and other specified forms of tremor 01/26/2013  . Memory difficulty 01/26/2013    Current Outpatient Medications on File Prior to Visit  Medication Sig Dispense Refill  . albuterol (PROVENTIL HFA;VENTOLIN HFA) 108 (90 BASE) MCG/ACT inhaler Inhale 2 puffs into the lungs every 6 (six) hours as needed for wheezing.    Marland Kitchen aspirin 81 MG chewable tablet aspirin 81 mg chewable tablet  CHEW 1 TABLET TWICE A DAY BY ORAL ROUTE FOR 30 DAYS.    Marland Kitchen atorvastatin (LIPITOR) 40 MG tablet Take 40 mg by mouth daily.     Marland Kitchen buPROPion (WELLBUTRIN SR) 150 MG 12 hr tablet Take 150 mg by mouth 2 (two) times daily.     . busPIRone (BUSPAR) 10 MG tablet Take 10 mg by mouth 2 (two) times daily.     Marland Kitchen docusate sodium (COLACE) 100 MG capsule Take 1 capsule (100 mg total) by mouth 2 (two) times daily. 28 capsule 0  . ferrous sulfate (FERROUSUL) 325 (65 FE) MG tablet Take 1 tablet (325 mg total) by mouth 3 (three) times daily with meals for 14 days. 42 tablet 0  . FLOVENT HFA 220 MCG/ACT inhaler Inhale 2 puffs into the lungs in the morning and at  bedtime.     . furosemide (LASIX) 20 MG tablet     . HYDROcodone-acetaminophen (NORCO) 7.5-325 MG tablet Take 1-2 tablets by mouth every 4 (four) hours as needed for moderate pain. 60 tablet 0  . HYDROcodone-homatropine (HYDROMET) 5-1.5 MG/5ML syrup Take 5 mLs by mouth every 6 (six) hours as needed for cough. 60 mL 0  . meloxicam (MOBIC) 7.5 MG tablet     . MOVANTIK 25 MG TABS tablet     . NEUPRO 1 MG/24HR PT24 Apply 1 patch topically daily.     . ondansetron (ZOFRAN) 4 MG tablet Take 1 tablet (4 mg total) by mouth every 6 (six) hours. 12 tablet 0  . oxybutynin (DITROPAN XL) 15 MG 24 hr tablet     . oxybutynin (DITROPAN) 5 MG tablet Take 5 mg by mouth 2 (two) times daily.    . polyethylene glycol (MIRALAX / GLYCOLAX) 17 g packet Take 17 g by mouth 2 (two) times daily. 28 packet 0  . predniSONE (DELTASONE) 5 MG tablet prednisone 5 mg tablets in a dose pack    . primidone (MYSOLINE) 50 MG tablet Take 25 mg by mouth at bedtime.     . rivaroxaban (XARELTO) 10 MG TABS tablet Take 1 tablet (10 mg total) by mouth daily. 14 tablet 0  . ropinirole (REQUIP) 5 MG tablet TAKE 1  TABLET BY MOUTH EVERYDAY AT BEDTIME 30 tablet 0  . SYMBICORT 160-4.5 MCG/ACT inhaler Inhale 2 puffs into the lungs in the morning and at bedtime.     . terbinafine (LAMISIL) 250 MG tablet Take 1 tablet (250 mg total) by mouth daily. 90 tablet 0  . tiZANidine (ZANAFLEX) 2 MG tablet Take 2 tablets (4 mg total) by mouth every 6 (six) hours as needed for muscle spasms. 14 tablet 0  . traZODone (DESYREL) 50 MG tablet trazodone 50 mg tablet     No current facility-administered medications on file prior to visit.    No Known Allergies  Objective: Physical Exam  General: Well developed, nourished, no acute distress, awake, alert and oriented x 3  Vascular: Dorsalis pedis artery 2/4 bilateral, Posterior tibial artery1/4 bilateral, skin temperature warm to warm proximal to distal bilateral lower extremities, no varicosities, pedal  hair present bilateral.  Neurological: Gross sensation present via light touch bilateral.Uncontrolled twitches of all toes as previously noted.   Dermatological: Skin is warm, dry, and supple bilateral, Nails 1-10 are tender, short thick, and discolored with mild subungal debris, no webspace macerations present bilateral, no open lesions present bilateral, minimal callus noted to left 1st toe.  Dry skin bilateral.  No signs of infection bilateral.  Musculoskeletal:+ Pes planus and hammertoe and bunionboney deformities noted bilateral. Muscular strength 4/5without painon range of motion. No pain with calf compression bilateral.  Assessment and Plan:  Problem List Items Addressed This Visit      Musculoskeletal and Integument   Onychomycosis - Primary    Other Visit Diagnoses    Tremor       Bilateral foot pain          -Examined patient -At no charge debrided nails x 10 using sterile nail nipper without incident and callus at left 1st toe using rotary bur -Advised patient that we will continue to monitor her nails as they grow out since she has completed a course/year of Lamisil -Advised good hygiene habits and educated patient on proper foot care to prevent re-infection and encouraged daily skin emollients for dry skin -Patient to return in for nail care as needed or sooner if problems or issues arise  Landis Martins, DPM

## 2020-05-26 ENCOUNTER — Other Ambulatory Visit: Payer: Self-pay

## 2020-05-26 ENCOUNTER — Ambulatory Visit (INDEPENDENT_AMBULATORY_CARE_PROVIDER_SITE_OTHER): Payer: Medicare HMO | Admitting: Ophthalmology

## 2020-05-26 ENCOUNTER — Encounter (INDEPENDENT_AMBULATORY_CARE_PROVIDER_SITE_OTHER): Payer: Self-pay | Admitting: Ophthalmology

## 2020-05-26 DIAGNOSIS — H353131 Nonexudative age-related macular degeneration, bilateral, early dry stage: Secondary | ICD-10-CM | POA: Diagnosis not present

## 2020-05-26 DIAGNOSIS — Z961 Presence of intraocular lens: Secondary | ICD-10-CM | POA: Diagnosis not present

## 2020-05-26 DIAGNOSIS — H3581 Retinal edema: Secondary | ICD-10-CM

## 2020-05-26 NOTE — Progress Notes (Signed)
[facility] Note  05/26/2020     CHIEF COMPLAINT Patient presents for Retina Evaluation   HISTORY OF PRESENT ILLNESS: Jasmine Estrada is a 65 y.o. female who presents to the clinic today for:   HPI    Retina Evaluation    In right eye.  Associated Symptoms Negative for Flashes, Floaters, Distortion, Blind Spot, Pain, Redness, Photophobia, Glare, Trauma, Scalp Tenderness, Jaw Claudication, Shoulder/Hip pain, Fever, Weight Loss and Fatigue.  Context:  distance vision, mid-range vision and near vision.  Treatments tried include no treatments.  I, the attending physician,  performed the HPI with the patient and updated documentation appropriately.          Comments    Patient referred by Dr. Shirley Muscat for possible RD OD. Patient was in office today for glasses exam. Patient denies floaters, flashes, etc.       Last edited by Bernarda Caffey, MD on 05/26/2020 11:55 AM. (History)    pt is here on the referral of Dr. Shirley Muscat for possible RD OS, pt saw him for a routine eye exam this morning, per pt and her friend Dr. Shirley Muscat told them he saw a hole in her retinal and she could go "bind" if it wasn't fixed, pt states she had cataract and glaucoma sx 2 years ago, but she can't remember the name of the dr, pt denies fol or floaters, she also denies being diabetic or hypertensive  Referring physician: [facility] [address] [place],  [place] [address]  HISTORICAL INFORMATION:   Selected notes from the MEDICAL RECORD NUMBER Referred by Dr. Thurston Hole for concern of RD OS LEE:  Ocular Hx- PMH-    CURRENT MEDICATIONS: No current outpatient medications on file. (Ophthalmic Drugs)   No current facility-administered medications for this visit. (Ophthalmic Drugs)   Current Outpatient Medications (Other)  Medication Sig  . albuterol (PROVENTIL HFA;VENTOLIN HFA) 108 (90 BASE) MCG/ACT inhaler Inhale 2 puffs into the lungs every 6  (six) hours as needed for wheezing.  Marland Kitchen aspirin 81 MG chewable tablet aspirin 81 mg chewable tablet  CHEW 1 TABLET TWICE A DAY BY ORAL ROUTE FOR 30 DAYS.  Marland Kitchen atorvastatin (LIPITOR) 40 MG tablet Take 40 mg by mouth daily.   Marland Kitchen buPROPion (WELLBUTRIN SR) 150 MG 12 hr tablet Take 150 mg by mouth 2 (two) times daily.   . busPIRone (BUSPAR) 10 MG tablet Take 10 mg by mouth 2 (two) times daily.   Marland Kitchen docusate sodium (COLACE) 100 MG capsule Take 1 capsule (100 mg total) by mouth 2 (two) times daily.  Marland Kitchen FLOVENT HFA 220 MCG/ACT inhaler Inhale 2 puffs into the lungs in the morning and at bedtime.   . furosemide (LASIX) 20 MG tablet   . HYDROcodone-acetaminophen (NORCO) 7.5-325 MG tablet Take 1-2 tablets by mouth every 4 (four) hours as needed for moderate pain.  Marland Kitchen HYDROcodone-homatropine (HYDROMET) 5-1.5 MG/5ML syrup Take 5 mLs by mouth every 6 (six) hours as needed for cough.  . meloxicam (MOBIC) 7.5 MG tablet   . MOVANTIK 25 MG TABS tablet   . NEUPRO 1 MG/24HR PT24 Apply 1 patch topically daily.   . ondansetron (ZOFRAN) 4 MG tablet Take 1 tablet (4 mg total) by mouth every 6 (six) hours.  Marland Kitchen oxybutynin (DITROPAN XL) 15 MG 24 hr tablet   . oxybutynin (DITROPAN) 5 MG tablet Take 5 mg by mouth 2 (two) times daily.  . polyethylene glycol (MIRALAX / GLYCOLAX) 17  g packet Take 17 g by mouth 2 (two) times daily.  . primidone (MYSOLINE) 50 MG tablet Take 25 mg by mouth at bedtime.   . rivaroxaban (XARELTO) 10 MG TABS tablet Take 1 tablet (10 mg total) by mouth daily.  . ropinirole (REQUIP) 5 MG tablet TAKE 1 TABLET BY MOUTH EVERYDAY AT BEDTIME  . SYMBICORT 160-4.5 MCG/ACT inhaler Inhale 2 puffs into the lungs in the morning and at bedtime.   . terbinafine (LAMISIL) 250 MG tablet Take 1 tablet (250 mg total) by mouth daily.  Marland Kitchen tiZANidine (ZANAFLEX) 2 MG tablet Take 2 tablets (4 mg total) by mouth every 6 (six) hours as needed for muscle spasms.  . traZODone (DESYREL) 50 MG tablet trazodone 50 mg tablet  . ferrous  sulfate (FERROUSUL) 325 (65 FE) MG tablet Take 1 tablet (325 mg total) by mouth 3 (three) times daily with meals for 14 days.  . predniSONE (DELTASONE) 5 MG tablet prednisone 5 mg tablets in a dose pack (Patient not taking: Reported on 05/26/2020)   No current facility-administered medications for this visit. (Other)      REVIEW OF SYSTEMS: ROS    Positive for: Musculoskeletal, Eyes, Respiratory   Negative for: Constitutional, Gastrointestinal, Neurological, Skin, Genitourinary, HENT, Endocrine, Cardiovascular, Psychiatric, Allergic/Imm, Heme/Lymph   Last edited by Roselee Nova D, COT on 05/26/2020 11:07 AM. (History)       ALLERGIES No Known Allergies  PAST MEDICAL HISTORY Past Medical History:  Diagnosis Date  . Arthritis    legs  . Asthma    prn inhaler  . Cough 07/15/2012  . Dyspnea   . Essential and other specified forms of tremor 01/26/2013  . Full dentures   . Mass of knee 07/2012   left  . Memory difficulty 01/26/2013  . Obesity   . Overactive bladder 2019  . Sleep apnea   . Stroke [facility])    right sided-weakness  . Tremor    right foot  . Urinary frequency    Past Surgical History:  Procedure Laterality Date  . ACHILLES TENDON LENGTHENING Right 02/25/2015  . BOTOX INJECTION N/A 11/19/2017   Procedure: CYSTOSCOPY BOTOX INJECTION;  Surgeon: Bjorn Loser, MD;  Location: [facility];  Service: Urology;  Laterality: N/A;  . CATARACT EXTRACTION Bilateral 2020  . IRIDOTOMY / IRIDECTOMY Bilateral   . JOINT REPLACEMENT    . MASS EXCISION Left 07/21/2012   Procedure: EXCISION OF LARGE MASS LEFT KNEE WITH LIPOSUCTION ASSISTED;  Surgeon: Cristine Polio, MD;  Location: [facility];  Service: Plastics;  Laterality: Left;  . TOTAL KNEE ARTHROPLASTY Right 09/24/2019   Procedure: TOTAL KNEE ARTHROPLASTY;  Surgeon: Paralee Cancel, MD;  Location: [facility];  Service: Orthopedics;  Laterality: Right;  9mins  . TUBAL LIGATION  1982    FAMILY  HISTORY Family History  Problem Relation Age of Onset  . Cancer Sister   . Diabetes Sister   . Mental illness Brother   . Alzheimer's disease Maternal Aunt   . Alzheimer's disease Maternal Grandmother   . Mental illness Sister     SOCIAL HISTORY Social History   Tobacco Use  . Smoking status: Current Every Day Smoker    Packs/day: 0.50    Years: 44.00    Pack years: 22.00    Types: Cigarettes  . Smokeless tobacco: Never Used  . Tobacco comment: 1 pack every 4 days  Vaping Use  . Vaping Use: Never used  Substance Use Topics  . Alcohol use: Yes  Comment: rare  . Drug use: No         OPHTHALMIC EXAM:  Base Eye Exam    Visual Acuity (Snellen - Linear)      Right Left   Dist [place] 20/25 -1 20/25 -1   Dist ph [place] NI NI       Tonometry (Tonopen, 11:11 AM)      Right Left   Pressure 18 15       Pupils      Dark Light Shape React APD   Right 8 8 Round Minimal None   Left 8 8 Round Minimal None       Visual Fields (Counting fingers)      Left Right    Full Full       Extraocular Movement      Right Left    Full, Ortho Full, Ortho       Neuro/Psych    Oriented x3: Yes   Mood/Affect: Normal       Dilation    Both eyes: 1.0% Mydriacyl, 2.5% Phenylephrine @ 11:09 AM        Slit Lamp and Fundus Exam    Slit Lamp Exam      Right Left   Lids/Lashes Dermatochalasis - upper lid Dermatochalasis - upper lid   Conjunctiva/Sclera mild melanosis mild melanosis   Cornea arcus, tear film debris, well healed temporal cataract wounds, trace Guttata arcus, tear film debris, well healed temporal cataract wounds, trace Guttata   Anterior Chamber Deep and quiet Deep and quiet   Iris Round and dilated Round and dilated   Lens PC IOL in good position PC IOL in good position   Vitreous Vitreous syneresis Vitreous syneresis, no pigment       Fundus Exam      Right Left   Disc Pink and Sharp Pink and Sharp, mild temporal PPA   C/D Ratio 0.5 0.4   Macula Flat, Good  foveal reflex, RPE mottling, No heme or edema Flat, Good foveal reflex, mild RPE mottling, No heme or edema   Vessels mild tortuousity mild tortuousity   Periphery Attached; mild WWP Attached, focal druse / RPE change at 0900; mild WWP          Refraction    Manifest Refraction      Sphere Cylinder Axis Dist VA   Right [place] +1.25 005 20/25   Left [place] +1.25 170 20/25          IMAGING AND PROCEDURES  Imaging and Procedures for 05/26/2020  OCT, Retina - OU - Both Eyes       Right Eye Quality was good. Central Foveal Thickness: 248. Progression has no prior data. Findings include normal foveal contour, no IRF, no SRF, retinal drusen .   Left Eye Quality was good. Central Foveal Thickness: 251. Progression has no prior data. Findings include normal foveal contour, no IRF, no SRF, retinal drusen , pigment epithelial detachment (Partial PVD, low lying peripapillary PED and drusen).   Notes *Images captured and stored on drive  Diagnosis / Impression:  NFP, no IRF/SRF, +drusen OU  Clinical management:  See below  Abbreviations: NFP - Normal foveal profile. CME - cystoid macular edema. PED - pigment epithelial detachment. IRF - intraretinal fluid. SRF - subretinal fluid. EZ - ellipsoid zone. ERM - epiretinal membrane. ORA - outer retinal atrophy. ORT - outer retinal tubulation. SRHM - subretinal hyper-reflective material. IRHM - intraretinal hyper-reflective material  ASSESSMENT/PLAN:    ICD-10-CM   1. Early dry stage nonexudative age-related macular degeneration of both eyes  H35.3131   2. Retinal edema  H35.81 OCT, Retina - OU - Both Eyes  3. Pseudophakia, both eyes  Z96.1     1,2. Age related macular degeneration, non-exudative, both eyes  - early dry stage w/ mild drusen  - focal drusen / RPE changes at 0900 periphery -- no retinal hole or detachment  - The incidence, anatomy, and pathology of dry AMD, risk of progression, and the AREDS and  AREDS 2 study including smoking risks discussed with patient.  - Recommend amsler grid monitoring  - pt is cleared from a retina standpoint for release to Dr. Shirley Muscat and resumption of primary eye care  3. Pseudophakia OU  - s/p CE/IOL  - IOL in good position, doing well  - monitor  Ophthalmic Meds Ordered this visit:  No orders of the defined types were placed in this encounter.      Return if symptoms worsen or fail to improve.  There are no Patient Instructions on file for this visit.   Explained the diagnoses, plan, and follow up with the patient and they expressed understanding.  Patient expressed understanding of the importance of proper follow up care.   This document serves as a record of services personally performed by Gardiner Sleeper, MD, PhD. It was created on their behalf by San Jetty. Owens Shark, OA an ophthalmic technician. The creation of this record is the provider's dictation and/or activities during the visit.    Electronically signed by: San Jetty. Owens Shark, [place] 02.10.2022 11:59 AM   Gardiner Sleeper, M.D., Ph.D. Diseases & Surgery of the Retina and Vitreous [facility]  I have reviewed the above documentation for accuracy and completeness, and I agree with the above. Gardiner Sleeper, M.D., Ph.D. 05/26/20 11:59 AM   Abbreviations: M myopia (nearsighted); A astigmatism; H hyperopia (farsighted); P presbyopia; Mrx spectacle prescription;  CTL contact lenses; OD right eye; OS left eye; OU both eyes  XT exotropia; ET esotropia; PEK punctate epithelial keratitis; PEE punctate epithelial erosions; DES dry eye syndrome; MGD meibomian gland dysfunction; ATs artificial tears; PFAT's preservative free artificial tears; [place] nuclear sclerotic cataract; PSC posterior subcapsular cataract; ERM epi-retinal membrane; PVD posterior vitreous detachment; RD retinal detachment; DM diabetes mellitus; DR diabetic retinopathy; NPDR non-proliferative diabetic retinopathy;  PDR proliferative diabetic retinopathy; CSME clinically significant macular edema; DME diabetic macular edema; dbh dot blot hemorrhages; CWS cotton wool spot; POAG primary open angle glaucoma; C/D cup-to-disc ratio; HVF humphrey visual field; GVF goldmann visual field; OCT optical coherence tomography; IOP intraocular pressure; BRVO Branch retinal vein occlusion; CRVO central retinal vein occlusion; CRAO central retinal artery occlusion; BRAO branch retinal artery occlusion; RT retinal tear; SB scleral buckle; PPV pars plana vitrectomy; VH Vitreous hemorrhage; PRP panretinal laser photocoagulation; IVK intravitreal kenalog; VMT vitreomacular traction; MH Macular hole;  NVD neovascularization of the disc; NVE neovascularization elsewhere; AREDS age related eye disease study; ARMD age related macular degeneration; POAG primary open angle glaucoma; EBMD epithelial/anterior basement membrane dystrophy; ACIOL anterior chamber intraocular lens; IOL intraocular lens; PCIOL posterior chamber intraocular lens; Phaco/IOL phacoemulsification with intraocular lens placement; [place] photorefractive keratectomy; LASIK laser assisted in situ keratomileusis; HTN hypertension; DM diabetes mellitus; COPD chronic obstructive pulmonary disease

## 2020-06-04 ENCOUNTER — Other Ambulatory Visit: Payer: Self-pay | Admitting: Sports Medicine

## 2020-06-04 DIAGNOSIS — R251 Tremor, unspecified: Secondary | ICD-10-CM

## 2020-06-04 DIAGNOSIS — G2581 Restless legs syndrome: Secondary | ICD-10-CM

## 2020-06-05 NOTE — Telephone Encounter (Signed)
Please advise 

## 2020-07-08 ENCOUNTER — Other Ambulatory Visit: Payer: Self-pay | Admitting: Sports Medicine

## 2020-07-08 DIAGNOSIS — G2581 Restless legs syndrome: Secondary | ICD-10-CM

## 2020-07-08 DIAGNOSIS — R251 Tremor, unspecified: Secondary | ICD-10-CM

## 2020-07-08 NOTE — Telephone Encounter (Signed)
Please Advise

## 2020-07-31 ENCOUNTER — Other Ambulatory Visit: Payer: Self-pay | Admitting: Sports Medicine

## 2020-07-31 DIAGNOSIS — R251 Tremor, unspecified: Secondary | ICD-10-CM

## 2020-07-31 DIAGNOSIS — G2581 Restless legs syndrome: Secondary | ICD-10-CM

## 2020-07-31 NOTE — Telephone Encounter (Signed)
Please advise 

## 2020-08-06 ENCOUNTER — Other Ambulatory Visit: Payer: Self-pay | Admitting: Sports Medicine

## 2020-08-06 DIAGNOSIS — G2581 Restless legs syndrome: Secondary | ICD-10-CM

## 2020-08-06 DIAGNOSIS — R251 Tremor, unspecified: Secondary | ICD-10-CM

## 2020-08-08 NOTE — Telephone Encounter (Signed)
Please advise 

## 2020-08-18 ENCOUNTER — Other Ambulatory Visit: Payer: Self-pay

## 2020-08-18 ENCOUNTER — Encounter: Payer: Self-pay | Admitting: Sports Medicine

## 2020-08-18 ENCOUNTER — Ambulatory Visit (INDEPENDENT_AMBULATORY_CARE_PROVIDER_SITE_OTHER): Payer: Medicare HMO | Admitting: Sports Medicine

## 2020-08-18 DIAGNOSIS — L84 Corns and callosities: Secondary | ICD-10-CM

## 2020-08-18 DIAGNOSIS — M79674 Pain in right toe(s): Secondary | ICD-10-CM

## 2020-08-18 DIAGNOSIS — M79671 Pain in right foot: Secondary | ICD-10-CM

## 2020-08-18 DIAGNOSIS — M79675 Pain in left toe(s): Secondary | ICD-10-CM | POA: Diagnosis not present

## 2020-08-18 DIAGNOSIS — R251 Tremor, unspecified: Secondary | ICD-10-CM

## 2020-08-18 DIAGNOSIS — M79672 Pain in left foot: Secondary | ICD-10-CM

## 2020-08-18 DIAGNOSIS — B351 Tinea unguium: Secondary | ICD-10-CM

## 2020-08-18 NOTE — Patient Instructions (Signed)
Robyn N. Baird Cancer, MD Internist in [place], [place] Address: [address], [place], [place] [address] Phone: [phone]

## 2020-08-18 NOTE — Progress Notes (Signed)
Subjective: Jasmine Estrada is a 64 y.o. female patient seen today in office for follow up evaluation and routine nail care.  Patient reports that she is looking for new primary doctor was referred to a neurologist she thinks but has not heard anything about this appointment in months.  Patient denies any other pedal complaints at this time.  Patient Active Problem List   Diagnosis Date Noted  . Obese 09/25/2019  . Right knee OA 09/25/2019  . S/P right TKA 09/24/2019  . Pain in left knee 07/29/2019  . Pain in right knee 07/29/2019  . Ankle edema 04/12/2015  . Status post right foot surgery 03/02/2015  . Equinus deformity of foot, acquired 02/02/2015  . Tenosynovitis of ankle 01/03/2015  . Posterior tibial tendon dysfunction 01/03/2015  . Onychomycosis 01/03/2015  . Urge urinary incontinence 11/11/2014  . Hematuria, microscopic 09/23/2014  . OAB (overactive bladder) 09/23/2014  . Lumbago 08/12/2013  . Pain in limb 05/25/2013  . Essential and other specified forms of tremor 01/26/2013  . Memory difficulty 01/26/2013    Current Outpatient Medications on File Prior to Visit  Medication Sig Dispense Refill  . albuterol (PROVENTIL HFA;VENTOLIN HFA) 108 (90 BASE) MCG/ACT inhaler Inhale 2 puffs into the lungs every 6 (six) hours as needed for wheezing.    Marland Kitchen aspirin 81 MG chewable tablet aspirin 81 mg chewable tablet  CHEW 1 TABLET TWICE A DAY BY ORAL ROUTE FOR 30 DAYS.    Marland Kitchen ASPIRIN 81 PO aspirin    . atorvastatin (LIPITOR) 40 MG tablet Take 40 mg by mouth daily.     Marland Kitchen buPROPion (WELLBUTRIN SR) 150 MG 12 hr tablet Take 150 mg by mouth 2 (two) times daily.     . busPIRone (BUSPAR) 10 MG tablet Take 10 mg by mouth 2 (two) times daily.     . celecoxib (CELEBREX) 200 MG capsule celecoxib 200 mg capsule  TAKE 1 CAPSULE BY MOUTH EVERY DAY FOR OSTEOARTHRITIS    . docusate sodium (COLACE) 100 MG capsule Take 1 capsule (100 mg total) by mouth 2 (two) times daily. 28 capsule 0  . ferrous sulfate  (FERROUSUL) 325 (65 FE) MG tablet Take 1 tablet (325 mg total) by mouth 3 (three) times daily with meals for 14 days. 42 tablet 0  . FLOVENT HFA 220 MCG/ACT inhaler Inhale 2 puffs into the lungs in the morning and at bedtime.     . furosemide (LASIX) 20 MG tablet     . HYDROcodone-acetaminophen (NORCO) 7.5-325 MG tablet Take 1-2 tablets by mouth every 4 (four) hours as needed for moderate pain. 60 tablet 0  . HYDROcodone-homatropine (HYDROMET) 5-1.5 MG/5ML syrup Take 5 mLs by mouth every 6 (six) hours as needed for cough. 60 mL 0  . meloxicam (MOBIC) 7.5 MG tablet     . mirabegron ER (MYRBETRIQ) 25 MG TB24 tablet Myrbetriq 25 mg tablet,extended release  TAKE 1 TABLET ONCE DAILY SWALLOWING WHOLE WITH WATER. DO NOT CRUSH, CHEW AND/OR DIVIDE    . MOVANTIK 25 MG TABS tablet     . NEUPRO 1 MG/24HR PT24 Apply 1 patch topically daily.     . Olopatadine HCl 0.2 % SOLN Apply 1 drop to eye daily.    . ondansetron (ZOFRAN) 4 MG tablet Take 1 tablet (4 mg total) by mouth every 6 (six) hours. 12 tablet 0  . oxybutynin (DITROPAN XL) 15 MG 24 hr tablet     . oxybutynin (DITROPAN) 5 MG tablet Take 5 mg by mouth 2 (  two) times daily.    . polyethylene glycol (MIRALAX / GLYCOLAX) 17 g packet Take 17 g by mouth 2 (two) times daily. 28 packet 0  . predniSONE (DELTASONE) 5 MG tablet prednisone 5 mg tablets in a dose pack (Patient not taking: Reported on 05/26/2020)    . primidone (MYSOLINE) 50 MG tablet Take 25 mg by mouth at bedtime.     . RESTASIS 0.05 % ophthalmic emulsion 1 drop 2 (two) times daily.    . rivaroxaban (XARELTO) 10 MG TABS tablet Take 1 tablet (10 mg total) by mouth daily. 14 tablet 0  . ropinirole (REQUIP) 5 MG tablet TAKE 1 TABLET BY MOUTH EVERYDAY AT BEDTIME 90 tablet 1  . SYMBICORT 160-4.5 MCG/ACT inhaler Inhale 2 puffs into the lungs in the morning and at bedtime.     . terbinafine (LAMISIL) 250 MG tablet Take 1 tablet (250 mg total) by mouth daily. 90 tablet 0  . tiZANidine (ZANAFLEX) 2 MG  tablet Take 2 tablets (4 mg total) by mouth every 6 (six) hours as needed for muscle spasms. 14 tablet 0  . tiZANidine (ZANAFLEX) 4 MG tablet Take 4 mg by mouth 3 (three) times daily.    . traZODone (DESYREL) 50 MG tablet trazodone 50 mg tablet     No current facility-administered medications on file prior to visit.    No Known Allergies  Objective: Physical Exam  General: Well developed, nourished, no acute distress, awake, alert and oriented x 3  Vascular: Dorsalis pedis artery 2/4 bilateral, Posterior tibial artery1/4 bilateral, skin temperature warm to warm proximal to distal bilateral lower extremities, no varicosities, pedal hair present bilateral.  Neurological: Gross sensation present via light touch bilateral.Uncontrolled twitches of all toes as previously noted.   Dermatological: Skin is warm, dry, and supple bilateral, Nails 1-10 are tender, short thick, and discolored with mild subungal debris, no webspace macerations present bilateral, no open lesions present bilateral, minimal callus noted to left 1st toe.  Dry skin bilateral.  No signs of infection bilateral.  Musculoskeletal:+ Pes planus and hammertoe and bunionboney deformities noted bilateral. Muscular strength 4/5without painon range of motion. No pain with calf compression bilateral.  Assessment and Plan:  Problem List Items Addressed This Visit      Musculoskeletal and Integument   Onychomycosis - Primary    Other Visit Diagnoses    Tremor       Callus of foot       Bilateral foot pain          -Examined patient -Mechanically debrided nails x 10 using sterile nail nipper without incident and callus at left 1st toe using rotary bur -Advised good hygiene habits and educated patient on proper foot care -Advised patient to discuss with PCP however if she prefers to see another doctor I have recommended Dr. Glendale Chard, internist -Patient to return in for nail care as needed or sooner if problems or  issues arise  Landis Martins, DPM

## 2020-09-22 ENCOUNTER — Other Ambulatory Visit: Payer: Self-pay | Admitting: Family

## 2020-09-22 DIAGNOSIS — Z122 Encounter for screening for malignant neoplasm of respiratory organs: Secondary | ICD-10-CM

## 2020-10-20 ENCOUNTER — Other Ambulatory Visit: Payer: Self-pay

## 2020-10-20 ENCOUNTER — Ambulatory Visit
Admission: RE | Admit: 2020-10-20 | Discharge: 2020-10-20 | Disposition: A | Discharge status: Home / Self Care | Payer: Medicare HMO | Source: Ambulatory Visit | Attending: Family | Admitting: Family

## 2020-10-20 DIAGNOSIS — Z122 Encounter for screening for malignant neoplasm of respiratory organs: Secondary | ICD-10-CM

## 2020-11-24 ENCOUNTER — Ambulatory Visit: Payer: Medicare HMO | Admitting: Sports Medicine

## 2020-12-21 ENCOUNTER — Other Ambulatory Visit: Payer: Self-pay | Admitting: Internal Medicine

## 2020-12-21 DIAGNOSIS — E213 Hyperparathyroidism, unspecified: Secondary | ICD-10-CM

## 2021-01-12 ENCOUNTER — Ambulatory Visit (INDEPENDENT_AMBULATORY_CARE_PROVIDER_SITE_OTHER): Payer: Medicare HMO | Admitting: Sports Medicine

## 2021-01-12 ENCOUNTER — Other Ambulatory Visit: Payer: Self-pay

## 2021-01-12 ENCOUNTER — Encounter: Payer: Self-pay | Admitting: Sports Medicine

## 2021-01-12 DIAGNOSIS — B351 Tinea unguium: Secondary | ICD-10-CM | POA: Diagnosis not present

## 2021-01-12 DIAGNOSIS — M79675 Pain in left toe(s): Secondary | ICD-10-CM | POA: Diagnosis not present

## 2021-01-12 DIAGNOSIS — R251 Tremor, unspecified: Secondary | ICD-10-CM

## 2021-01-12 DIAGNOSIS — L84 Corns and callosities: Secondary | ICD-10-CM

## 2021-01-12 DIAGNOSIS — M79674 Pain in right toe(s): Secondary | ICD-10-CM | POA: Diagnosis not present

## 2021-01-12 NOTE — Progress Notes (Signed)
Subjective: Jasmine Estrada is a 65 y.o. female patient seen today in office for follow up evaluation and routine nail care.  Patient reports that she is still looking for a new primary care doctor.  Denies any other concerns at this time.  Patient Active Problem List   Diagnosis Date Noted   Obese 09/25/2019   Right knee OA 09/25/2019   S/P right TKA 09/24/2019   Pain in left knee 07/29/2019   Pain in right knee 07/29/2019   Ankle edema 04/12/2015   Status post right foot surgery 03/02/2015   Equinus deformity of foot, acquired 02/02/2015   Tenosynovitis of ankle 01/03/2015   Posterior tibial tendon dysfunction 01/03/2015   Onychomycosis 01/03/2015   Urge urinary incontinence 11/11/2014   Hematuria, microscopic 09/23/2014   OAB (overactive bladder) 09/23/2014   Lumbago 08/12/2013   Pain in limb 05/25/2013   Essential and other specified forms of tremor 01/26/2013   Memory difficulty 01/26/2013    Current Outpatient Medications on File Prior to Visit  Medication Sig Dispense Refill   albuterol (PROVENTIL HFA;VENTOLIN HFA) 108 (90 BASE) MCG/ACT inhaler Inhale 2 puffs into the lungs every 6 (six) hours as needed for wheezing.     aspirin 81 MG chewable tablet aspirin 81 mg chewable tablet  CHEW 1 TABLET TWICE A DAY BY ORAL ROUTE FOR 30 DAYS.     ASPIRIN 81 PO aspirin     atorvastatin (LIPITOR) 40 MG tablet Take 40 mg by mouth daily.      buPROPion (WELLBUTRIN SR) 150 MG 12 hr tablet Take 150 mg by mouth 2 (two) times daily.      busPIRone (BUSPAR) 10 MG tablet Take 10 mg by mouth 2 (two) times daily.      celecoxib (CELEBREX) 200 MG capsule celecoxib 200 mg capsule  TAKE 1 CAPSULE BY MOUTH EVERY DAY FOR OSTEOARTHRITIS     docusate sodium (COLACE) 100 MG capsule Take 1 capsule (100 mg total) by mouth 2 (two) times daily. 28 capsule 0   ferrous sulfate (FERROUSUL) 325 (65 FE) MG tablet Take 1 tablet (325 mg total) by mouth 3 (three) times daily with meals for 14 days. 42 tablet 0    FLOVENT HFA 220 MCG/ACT inhaler Inhale 2 puffs into the lungs in the morning and at bedtime.      furosemide (LASIX) 20 MG tablet      HYDROcodone-acetaminophen (NORCO) 7.5-325 MG tablet Take 1-2 tablets by mouth every 4 (four) hours as needed for moderate pain. 60 tablet 0   HYDROcodone-homatropine (HYDROMET) 5-1.5 MG/5ML syrup Take 5 mLs by mouth every 6 (six) hours as needed for cough. 60 mL 0   meloxicam (MOBIC) 7.5 MG tablet      mirabegron ER (MYRBETRIQ) 25 MG TB24 tablet Myrbetriq 25 mg tablet,extended release  TAKE 1 TABLET ONCE DAILY SWALLOWING WHOLE WITH WATER. DO NOT CRUSH, CHEW AND/OR DIVIDE     MOVANTIK 25 MG TABS tablet      NEUPRO 1 MG/24HR PT24 Apply 1 patch topically daily.      Olopatadine HCl 0.2 % SOLN Apply 1 drop to eye daily.     ondansetron (ZOFRAN) 4 MG tablet Take 1 tablet (4 mg total) by mouth every 6 (six) hours. 12 tablet 0   oxybutynin (DITROPAN XL) 15 MG 24 hr tablet      oxybutynin (DITROPAN) 5 MG tablet Take 5 mg by mouth 2 (two) times daily.     polyethylene glycol (MIRALAX / GLYCOLAX) 17 g packet Take  17 g by mouth 2 (two) times daily. 28 packet 0   predniSONE (DELTASONE) 5 MG tablet prednisone 5 mg tablets in a dose pack (Patient not taking: Reported on 05/26/2020)     primidone (MYSOLINE) 50 MG tablet Take 25 mg by mouth at bedtime.      RESTASIS 0.05 % ophthalmic emulsion 1 drop 2 (two) times daily.     rivaroxaban (XARELTO) 10 MG TABS tablet Take 1 tablet (10 mg total) by mouth daily. 14 tablet 0   ropinirole (REQUIP) 5 MG tablet TAKE 1 TABLET BY MOUTH EVERYDAY AT BEDTIME 90 tablet 1   SYMBICORT 160-4.5 MCG/ACT inhaler Inhale 2 puffs into the lungs in the morning and at bedtime.      terbinafine (LAMISIL) 250 MG tablet Take 1 tablet (250 mg total) by mouth daily. 90 tablet 0   tiZANidine (ZANAFLEX) 2 MG tablet Take 2 tablets (4 mg total) by mouth every 6 (six) hours as needed for muscle spasms. 14 tablet 0   tiZANidine (ZANAFLEX) 4 MG tablet Take 4 mg by  mouth 3 (three) times daily.     traZODone (DESYREL) 50 MG tablet trazodone 50 mg tablet     No current facility-administered medications on file prior to visit.    No Known Allergies  Objective: Physical Exam  General: Well developed, nourished, no acute distress, awake, alert and oriented x 3   Vascular: Dorsalis pedis artery 2/4 bilateral, Posterior tibial artery 1/4 bilateral, skin temperature warm to warm proximal to distal bilateral lower extremities, no varicosities, pedal hair present bilateral.   Neurological: Gross sensation present via light touch bilateral. Uncontrolled twitches of all toes as previously noted.    Dermatological: Skin is warm, dry, and supple bilateral, Nails 1-10 are tender, short thick, and discolored with mild subungal debris, no webspace macerations present bilateral, no open lesions present bilateral, minimal callus noted to left 1st toe.  Dry skin bilateral.  No signs of infection bilateral.   Musculoskeletal: + Pes planus and hammertoe and bunion boney deformities noted bilateral. Muscular strength 4/5 without painon range of motion. No pain with calf compression bilateral.  Assessment and Plan:  Problem List Items Addressed This Visit   None Visit Diagnoses     Pain due to onychomycosis of toenails of both feet    -  Primary   Tremor       Callus of foot           -Examined patient -Mechanically debrided nails x 10 using sterile nail nipper without incident  -Recommended again Dr. Glendale Chard, internist -Patient to return in for nail care as needed or sooner if problems or issues arise  Landis Martins, DPM

## 2021-01-17 ENCOUNTER — Other Ambulatory Visit: Payer: Self-pay | Admitting: Family

## 2021-01-17 DIAGNOSIS — Z1231 Encounter for screening mammogram for malignant neoplasm of breast: Secondary | ICD-10-CM

## 2021-02-24 ENCOUNTER — Ambulatory Visit
Admission: RE | Admit: 2021-02-24 | Discharge: 2021-02-24 | Disposition: A | Discharge status: Home / Self Care | Payer: Medicare HMO | Source: Ambulatory Visit | Attending: Family | Admitting: Family

## 2021-02-24 ENCOUNTER — Other Ambulatory Visit: Payer: Self-pay

## 2021-02-24 DIAGNOSIS — Z1231 Encounter for screening mammogram for malignant neoplasm of breast: Secondary | ICD-10-CM

## 2021-03-13 ENCOUNTER — Ambulatory Visit: Payer: Medicare HMO

## 2021-03-20 ENCOUNTER — Ambulatory Visit (INDEPENDENT_AMBULATORY_CARE_PROVIDER_SITE_OTHER): Payer: Medicare HMO | Admitting: Family Medicine

## 2021-03-20 ENCOUNTER — Encounter: Payer: Self-pay | Admitting: Family Medicine

## 2021-03-20 ENCOUNTER — Other Ambulatory Visit: Payer: Self-pay

## 2021-03-20 VITALS — BP 121/75 | HR 91 | Temp 98.6°F | Resp 16 | Ht 65.75 in | Wt 238.8 lb

## 2021-03-20 DIAGNOSIS — Z7689 Persons encountering health services in other specified circumstances: Secondary | ICD-10-CM

## 2021-03-20 DIAGNOSIS — E7849 Other hyperlipidemia: Secondary | ICD-10-CM | POA: Diagnosis not present

## 2021-03-20 DIAGNOSIS — N3281 Overactive bladder: Secondary | ICD-10-CM

## 2021-03-20 DIAGNOSIS — Z6838 Body mass index (BMI) 38.0-38.9, adult: Secondary | ICD-10-CM

## 2021-03-20 MED ORDER — ATORVASTATIN CALCIUM 40 MG PO TABS: 40.0000 mg | ORAL_TABLET | Freq: Every day | ORAL | 1 refills | Status: AC

## 2021-03-20 MED ORDER — TIZANIDINE HCL 2 MG PO TABS
4.0000 mg | ORAL_TABLET | Freq: Every day | ORAL | 1 refills | Status: DC | PRN
Start: 2021-03-20 — End: 2021-09-05

## 2021-03-20 MED ORDER — CELECOXIB 200 MG PO CAPS
ORAL_CAPSULE | ORAL | 1 refills | Status: DC
Start: 2021-03-20 — End: 2021-08-28

## 2021-03-20 NOTE — Progress Notes (Signed)
Patient is here to est-care with provider. Patient is concern about not being able to get her supplies when she needs to.

## 2021-03-21 NOTE — Progress Notes (Signed)
New Patient Office Visit  Subjective:  Patient ID: Jasmine Estrada, female    DOB: Jul 05, 1955  Age: 65 y.o. MRN: [id number]  CC:  Chief Complaint  Patient presents with   Establish Care    HPI Jasmine Estrada presents for to establish care and for review of chronic med issues with med refills. She has been out of most of her meds for awhile. Patient denies acute complaints or concerns.   Past Medical History:  Diagnosis Date   Arthritis    legs   Asthma    prn inhaler   Cough 07/15/2012   Dyspnea    Essential and other specified forms of tremor 01/26/2013   Full dentures    Mass of knee 07/2012   left   Memory difficulty 01/26/2013   Obesity    Overactive bladder 2019   Sleep apnea    Stroke [facility])    right sided-weakness   Tremor    right foot   Urinary frequency     Past Surgical History:  Procedure Laterality Date   ACHILLES TENDON LENGTHENING Right 02/25/2015   BOTOX INJECTION N/A 11/19/2017   Procedure: CYSTOSCOPY BOTOX INJECTION;  Surgeon: Bjorn Loser, MD;  Location: [facility];  Service: Urology;  Laterality: N/A;   CATARACT EXTRACTION Bilateral 2020   IRIDOTOMY / IRIDECTOMY Bilateral    JOINT REPLACEMENT     MASS EXCISION Left 07/21/2012   Procedure: EXCISION OF LARGE MASS LEFT KNEE WITH LIPOSUCTION ASSISTED;  Surgeon: Cristine Polio, MD;  Location: [place];  Service: Plastics;  Laterality: Left;   TOTAL KNEE ARTHROPLASTY Right 09/24/2019   Procedure: TOTAL KNEE ARTHROPLASTY;  Surgeon: Paralee Cancel, MD;  Location: [facility];  Service: Orthopedics;  Laterality: Right;  54mins   TUBAL LIGATION  1982    Family History  Problem Relation Age of Onset   Breast cancer Sister    Cancer Sister    Diabetes Sister    Mental illness Sister    Alzheimer's disease Maternal Aunt    Alzheimer's disease Maternal Grandmother    Breast cancer Cousin    Mental illness Brother     Social History   Socioeconomic History   Marital  status: Divorced    Spouse name: Not on file   Number of children: 4   Years of education: HS   Highest education level: Not on file  Occupational History   Not on file  Tobacco Use   Smoking status: Every Day    Packs/day: 0.25    Years: 52.00    Pack years: 13.00    Types: Cigarettes   Smokeless tobacco: Never   Tobacco comments:    1 pack every 4 days  Vaping Use   Vaping Use: Never used  Substance and Sexual Activity   Alcohol use: Yes    Comment: rare   Drug use: No   Sexual activity: Not on file  Other Topics Concern   Not on file  Social History Narrative   Not on file   Social Determinants of Health   Financial Resource Strain: Not on file  Food Insecurity: Not on file  Transportation Needs: Not on file  Physical Activity: Not on file  Stress: Not on file  Social Connections: Not on file  Intimate Partner Violence: Not on file    ROS Review of Systems  Genitourinary:  Positive for frequency. Negative for dysuria.  All other systems reviewed and are negative.  Objective:   Today's Vitals:  BP 121/75   Pulse 91   Temp 98.6 F (37 C) (Oral)   Resp 16   Ht 5' 5.75" (1.67 m)   Wt 238 lb 12.8 oz (108.3 kg)   SpO2 93%   BMI 38.84 kg/m   Physical Exam Vitals and nursing note reviewed.  Constitutional:      General: She is not in acute distress.    Appearance: She is obese.  Cardiovascular:     Rate and Rhythm: Normal rate and regular rhythm.  Pulmonary:     Effort: Pulmonary effort is normal.     Breath sounds: Normal breath sounds.  Abdominal:     Palpations: Abdomen is soft.     Tenderness: There is no abdominal tenderness.  Neurological:     General: No focal deficit present.     Mental Status: She is alert and oriented to person, place, and time.    Assessment & Plan:   1. OAB (overactive bladder) Management is per consultant. Keep scheduled follow up with consultant for further eval/mgt  2. Class 2 severe obesity due to excess  calories with serious comorbidity and body mass index (BMI) of 38.0 to 38.9 in adult [facility]) Discussed dietary and activity options. Goal si 2-4lbs wt loss per month.   3. Other hyperlipidemia Continue present management. Meds refilled. Will monitor  4. Encounter to establish care     Outpatient Encounter Medications as of 03/20/2021  Medication Sig   albuterol (PROVENTIL HFA;VENTOLIN HFA) 108 (90 BASE) MCG/ACT inhaler Inhale 2 puffs into the lungs every 6 (six) hours as needed for wheezing.   aspirin 81 MG chewable tablet aspirin 81 mg chewable tablet  CHEW 1 TABLET TWICE A DAY BY ORAL ROUTE FOR 30 DAYS.   FLOVENT HFA 220 MCG/ACT inhaler Inhale 2 puffs into the lungs in the morning and at bedtime.    NEUPRO 1 MG/24HR PT24 Apply 1 patch topically daily.    oxybutynin (DITROPAN XL) 15 MG 24 hr tablet    oxybutynin (DITROPAN) 5 MG tablet Take 5 mg by mouth 2 (two) times daily.   primidone (MYSOLINE) 50 MG tablet Take 25 mg by mouth at bedtime.    [DISCONTINUED] ASPIRIN 81 PO aspirin   [DISCONTINUED] ropinirole (REQUIP) 5 MG tablet TAKE 1 TABLET BY MOUTH EVERYDAY AT BEDTIME   atorvastatin (LIPITOR) 40 MG tablet Take 1 tablet (40 mg total) by mouth daily.   celecoxib (CELEBREX) 200 MG capsule celecoxib 200 mg capsule  TAKE 1 CAPSULE BY MOUTH EVERY DAY FOR OSTEOARTHRITIS   tiZANidine (ZANAFLEX) 2 MG tablet Take 2 tablets (4 mg total) by mouth daily as needed for muscle spasms.   [DISCONTINUED] atorvastatin (LIPITOR) 40 MG tablet Take 40 mg by mouth daily.    [DISCONTINUED] buPROPion (WELLBUTRIN SR) 150 MG 12 hr tablet Take 150 mg by mouth 2 (two) times daily.    [DISCONTINUED] busPIRone (BUSPAR) 10 MG tablet Take 10 mg by mouth 2 (two) times daily.    [DISCONTINUED] celecoxib (CELEBREX) 200 MG capsule celecoxib 200 mg capsule  TAKE 1 CAPSULE BY MOUTH EVERY DAY FOR OSTEOARTHRITIS   [DISCONTINUED] docusate sodium (COLACE) 100 MG capsule Take 1 capsule (100 mg total) by mouth 2 (two) times  daily.   [DISCONTINUED] ferrous sulfate (FERROUSUL) 325 (65 FE) MG tablet Take 1 tablet (325 mg total) by mouth 3 (three) times daily with meals for 14 days.   [DISCONTINUED] furosemide (LASIX) 20 MG tablet    [DISCONTINUED] HYDROcodone-acetaminophen (NORCO) 7.5-325 MG tablet Take 1-2 tablets by  mouth every 4 (four) hours as needed for moderate pain.   [DISCONTINUED] HYDROcodone-homatropine (HYDROMET) 5-1.5 MG/5ML syrup Take 5 mLs by mouth every 6 (six) hours as needed for cough.   [DISCONTINUED] meloxicam (MOBIC) 7.5 MG tablet    [DISCONTINUED] mirabegron ER (MYRBETRIQ) 25 MG TB24 tablet Myrbetriq 25 mg tablet,extended release  TAKE 1 TABLET ONCE DAILY SWALLOWING WHOLE WITH WATER. DO NOT CRUSH, CHEW AND/OR DIVIDE   [DISCONTINUED] MOVANTIK 25 MG TABS tablet    [DISCONTINUED] Olopatadine HCl 0.2 % SOLN Apply 1 drop to eye daily.   [DISCONTINUED] ondansetron (ZOFRAN) 4 MG tablet Take 1 tablet (4 mg total) by mouth every 6 (six) hours.   [DISCONTINUED] polyethylene glycol (MIRALAX / GLYCOLAX) 17 g packet Take 17 g by mouth 2 (two) times daily.   [DISCONTINUED] predniSONE (DELTASONE) 5 MG tablet prednisone 5 mg tablets in a dose pack (Patient not taking: Reported on 05/26/2020)   [DISCONTINUED] RESTASIS 0.05 % ophthalmic emulsion 1 drop 2 (two) times daily.   [DISCONTINUED] rivaroxaban (XARELTO) 10 MG TABS tablet Take 1 tablet (10 mg total) by mouth daily.   [DISCONTINUED] SYMBICORT 160-4.5 MCG/ACT inhaler Inhale 2 puffs into the lungs in the morning and at bedtime.  (Patient not taking: Reported on 03/20/2021)   [DISCONTINUED] terbinafine (LAMISIL) 250 MG tablet Take 1 tablet (250 mg total) by mouth daily.   [DISCONTINUED] tiZANidine (ZANAFLEX) 2 MG tablet Take 2 tablets (4 mg total) by mouth every 6 (six) hours as needed for muscle spasms.   [DISCONTINUED] tiZANidine (ZANAFLEX) 4 MG tablet Take 4 mg by mouth 3 (three) times daily.   [DISCONTINUED] traZODone (DESYREL) 50 MG tablet trazodone 50 mg  tablet   No facility-administered encounter medications on file as of 03/20/2021.    Follow-up: Return in about 3 months (around 06/18/2021) for follow up.   [name], MD

## 2021-04-20 ENCOUNTER — Other Ambulatory Visit: Payer: Self-pay

## 2021-04-20 ENCOUNTER — Encounter: Payer: Self-pay | Admitting: Sports Medicine

## 2021-04-20 ENCOUNTER — Ambulatory Visit (INDEPENDENT_AMBULATORY_CARE_PROVIDER_SITE_OTHER): Payer: Medicare HMO | Admitting: Sports Medicine

## 2021-04-20 DIAGNOSIS — M79674 Pain in right toe(s): Secondary | ICD-10-CM | POA: Diagnosis not present

## 2021-04-20 DIAGNOSIS — M79671 Pain in right foot: Secondary | ICD-10-CM

## 2021-04-20 DIAGNOSIS — L84 Corns and callosities: Secondary | ICD-10-CM

## 2021-04-20 DIAGNOSIS — M79672 Pain in left foot: Secondary | ICD-10-CM

## 2021-04-20 DIAGNOSIS — B351 Tinea unguium: Secondary | ICD-10-CM | POA: Diagnosis not present

## 2021-04-20 DIAGNOSIS — R251 Tremor, unspecified: Secondary | ICD-10-CM

## 2021-04-20 DIAGNOSIS — M79675 Pain in left toe(s): Secondary | ICD-10-CM | POA: Diagnosis not present

## 2021-04-20 NOTE — Progress Notes (Signed)
Subjective: Jasmine Estrada is a 66 y.o. female patient seen today in office for follow up evaluation and routine nail care.  Patient reports PCP visit in Dec.  Denies any other concerns at this time.  Patient Active Problem List   Diagnosis Date Noted   Obese 09/25/2019   Right knee OA 09/25/2019   S/P right TKA 09/24/2019   Pain in left knee 07/29/2019   Pain in right knee 07/29/2019   Ankle edema 04/12/2015   Status post right foot surgery 03/02/2015   Equinus deformity of foot, acquired 02/02/2015   Tenosynovitis of ankle 01/03/2015   Posterior tibial tendon dysfunction 01/03/2015   Onychomycosis 01/03/2015   Urge urinary incontinence 11/11/2014   Hematuria, microscopic 09/23/2014   OAB (overactive bladder) 09/23/2014   Lumbago 08/12/2013   Pain in limb 05/25/2013   Essential and other specified forms of tremor 01/26/2013   Memory difficulty 01/26/2013    Current Outpatient Medications on File Prior to Visit  Medication Sig Dispense Refill   albuterol (PROVENTIL HFA;VENTOLIN HFA) 108 (90 BASE) MCG/ACT inhaler Inhale 2 puffs into the lungs every 6 (six) hours as needed for wheezing.     aspirin 81 MG chewable tablet aspirin 81 mg chewable tablet  CHEW 1 TABLET TWICE A DAY BY ORAL ROUTE FOR 30 DAYS.     atorvastatin (LIPITOR) 40 MG tablet Take 1 tablet (40 mg total) by mouth daily. 90 tablet 1   celecoxib (CELEBREX) 200 MG capsule celecoxib 200 mg capsule  TAKE 1 CAPSULE BY MOUTH EVERY DAY FOR OSTEOARTHRITIS 90 capsule 1   FLOVENT HFA 220 MCG/ACT inhaler Inhale 2 puffs into the lungs in the morning and at bedtime.      NEUPRO 1 MG/24HR PT24 Apply 1 patch topically daily.      oxybutynin (DITROPAN XL) 15 MG 24 hr tablet      oxybutynin (DITROPAN) 5 MG tablet Take 5 mg by mouth 2 (two) times daily.     primidone (MYSOLINE) 50 MG tablet Take 25 mg by mouth at bedtime.      tiZANidine (ZANAFLEX) 2 MG tablet Take 2 tablets (4 mg total) by mouth daily as needed for muscle spasms. 30  tablet 1   No current facility-administered medications on file prior to visit.    No Known Allergies  Objective: Physical Exam  General: Well developed, nourished, no acute distress, awake, alert and oriented x 3   Vascular: Dorsalis pedis artery 2/4 bilateral, Posterior tibial artery 1/4 bilateral, skin temperature warm to warm proximal to distal bilateral lower extremities, no varicosities, pedal hair present bilateral.   Neurological: Gross sensation present via light touch bilateral. Uncontrolled twitches of all toes as previously noted.    Dermatological: Skin is warm, dry, and supple bilateral, Nails 1-10 are tender, short thick, and discolored with mild subungal debris, no webspace macerations present bilateral, no open lesions present bilateral, minimal callus noted to left 1st toe.  Dry skin bilateral.  No signs of infection bilateral.   Musculoskeletal: + Pes planus and hammertoe and bunion boney deformities noted bilateral. Muscular strength 4/5 without painon range of motion. No pain with calf compression bilateral.  Assessment and Plan:  Problem List Items Addressed This Visit   None Visit Diagnoses     Pain due to onychomycosis of toenails of both feet    -  Primary   Tremor       Callus of foot       Bilateral foot pain           -  Examined patient -Mechanically debrided painful nails x 10 using sterile nail nipper without incident  -Continue with PCP follow-up -Patient to return in for nail care as needed or sooner if problems or issues arise  Landis Martins, DPM

## 2021-05-02 DIAGNOSIS — N952 Postmenopausal atrophic vaginitis: Secondary | ICD-10-CM | POA: Insufficient documentation

## 2021-06-08 ENCOUNTER — Ambulatory Visit
Admission: RE | Admit: 2021-06-08 | Discharge: 2021-06-08 | Disposition: A | Discharge status: Home / Self Care | Payer: Medicare HMO | Source: Ambulatory Visit | Attending: Internal Medicine | Admitting: Internal Medicine

## 2021-06-08 ENCOUNTER — Other Ambulatory Visit: Payer: Self-pay

## 2021-06-08 DIAGNOSIS — E213 Hyperparathyroidism, unspecified: Secondary | ICD-10-CM

## 2021-06-14 ENCOUNTER — Other Ambulatory Visit: Payer: Self-pay

## 2021-06-14 ENCOUNTER — Ambulatory Visit (INDEPENDENT_AMBULATORY_CARE_PROVIDER_SITE_OTHER): Payer: Medicare HMO | Admitting: Family Medicine

## 2021-06-14 ENCOUNTER — Encounter: Payer: Self-pay | Admitting: Family Medicine

## 2021-06-14 VITALS — BP 118/78 | HR 78 | Temp 98.1°F | Resp 16 | Wt 235.4 lb

## 2021-06-14 DIAGNOSIS — Z0000 Encounter for general adult medical examination without abnormal findings: Secondary | ICD-10-CM

## 2021-06-14 DIAGNOSIS — E559 Vitamin D deficiency, unspecified: Secondary | ICD-10-CM | POA: Diagnosis not present

## 2021-06-14 DIAGNOSIS — Z13220 Encounter for screening for lipoid disorders: Secondary | ICD-10-CM

## 2021-06-14 DIAGNOSIS — Z1329 Encounter for screening for other suspected endocrine disorder: Secondary | ICD-10-CM

## 2021-06-14 DIAGNOSIS — Z130 Encounter for screening for diseases of the blood and blood-forming organs and certain disorders involving the immune mechanism: Secondary | ICD-10-CM

## 2021-06-14 NOTE — Progress Notes (Signed)
Established Patient Office Visit  Subjective:  Patient ID: Jasmine Estrada, female    DOB: 08/14/1955  Age: 66 y.o. MRN: [id number]  CC:  Chief Complaint  Patient presents with   Annual Exam    HPI Jasmine Estrada presents for routine annual exam. Patient denies acute complaints.   Past Medical History:  Diagnosis Date   Arthritis    legs   Asthma    prn inhaler   Cough 07/15/2012   Dyspnea    Essential and other specified forms of tremor 01/26/2013   Full dentures    Mass of knee 07/2012   left   Memory difficulty 01/26/2013   Obesity    Overactive bladder 2019   Sleep apnea    Stroke [facility])    right sided-weakness   Tremor    right foot   Urinary frequency     Past Surgical History:  Procedure Laterality Date   ACHILLES TENDON LENGTHENING Right 02/25/2015   BOTOX INJECTION N/A 11/19/2017   Procedure: CYSTOSCOPY BOTOX INJECTION;  Surgeon: Bjorn Loser, MD;  Location: [facility];  Service: Urology;  Laterality: N/A;   CATARACT EXTRACTION Bilateral 2020   IRIDOTOMY / IRIDECTOMY Bilateral    JOINT REPLACEMENT     MASS EXCISION Left 07/21/2012   Procedure: EXCISION OF LARGE MASS LEFT KNEE WITH LIPOSUCTION ASSISTED;  Surgeon: Cristine Polio, MD;  Location: [place];  Service: Plastics;  Laterality: Left;   TOTAL KNEE ARTHROPLASTY Right 09/24/2019   Procedure: TOTAL KNEE ARTHROPLASTY;  Surgeon: Paralee Cancel, MD;  Location: [facility];  Service: Orthopedics;  Laterality: Right;  46mns   TUBAL LIGATION  1982    Family History  Problem Relation Age of Onset   Breast cancer Sister    Cancer Sister    Diabetes Sister    Mental illness Sister    Alzheimer's disease Maternal Aunt    Alzheimer's disease Maternal Grandmother    Breast cancer Cousin    Mental illness Brother     Social History   Socioeconomic History   Marital status: Divorced    Spouse name: Not on file   Number of children: 4   Years of education: HS   Highest  education level: Not on file  Occupational History   Not on file  Tobacco Use   Smoking status: Every Day    Packs/day: 0.25    Years: 52.00    Pack years: 13.00    Types: Cigarettes   Smokeless tobacco: Never   Tobacco comments:    1 pack every 4 days  Vaping Use   Vaping Use: Never used  Substance and Sexual Activity   Alcohol use: Yes    Comment: rare   Drug use: No   Sexual activity: Not on file  Other Topics Concern   Not on file  Social History Narrative   Not on file   Social Determinants of Health   Financial Resource Strain: Not on file  Food Insecurity: Not on file  Transportation Needs: Not on file  Physical Activity: Not on file  Stress: Not on file  Social Connections: Not on file  Intimate Partner Violence: Not on file    ROS Review of Systems  Genitourinary:  Positive for urgency. Negative for dysuria.  All other systems reviewed and are negative.  Objective:   Today's Vitals: BP 118/78   Pulse 78   Temp 98.1 F (36.7 C) (Oral)   Resp 16   Wt 235 lb  6.4 oz (106.8 kg)   SpO2 96%   BMI 38.28 kg/m   Physical Exam Vitals and nursing note reviewed.  Constitutional:      General: She is not in acute distress. Cardiovascular:     Rate and Rhythm: Normal rate and regular rhythm.  Pulmonary:     Effort: Pulmonary effort is normal.     Breath sounds: Normal breath sounds.  Abdominal:     Palpations: Abdomen is soft.     Tenderness: There is no abdominal tenderness.  Neurological:     General: No focal deficit present.     Mental Status: She is alert and oriented to person, place, and time.    Assessment & Plan:   1. Annual physical exam Routine labs ordered - CMP14+EGFR  2. Screening for deficiency anemia  - CBC with Differential  3. Screening for lipid disorders  - Lipid Panel  4. Screening for endocrine/metabolic/immunity disorders  - Hemoglobin A1c - TSH  5. Vitamin D deficiency  - Vitamin D,  25-hydroxy    Outpatient Encounter Medications as of 06/14/2021  Medication Sig   albuterol (PROVENTIL HFA;VENTOLIN HFA) 108 (90 BASE) MCG/ACT inhaler Inhale 2 puffs into the lungs every 6 (six) hours as needed for wheezing.   aspirin 81 MG chewable tablet aspirin 81 mg chewable tablet  CHEW 1 TABLET TWICE A DAY BY ORAL ROUTE FOR 30 DAYS.   atorvastatin (LIPITOR) 40 MG tablet Take 1 tablet (40 mg total) by mouth daily.   celecoxib (CELEBREX) 200 MG capsule celecoxib 200 mg capsule  TAKE 1 CAPSULE BY MOUTH EVERY DAY FOR OSTEOARTHRITIS   FLOVENT HFA 220 MCG/ACT inhaler Inhale 2 puffs into the lungs in the morning and at bedtime.    NEUPRO 1 MG/24HR PT24 Apply 1 patch topically daily.    oxybutynin (DITROPAN XL) 15 MG 24 hr tablet    oxybutynin (DITROPAN) 5 MG tablet Take 5 mg by mouth 2 (two) times daily.   primidone (MYSOLINE) 50 MG tablet Take 25 mg by mouth at bedtime.    tiZANidine (ZANAFLEX) 2 MG tablet Take 2 tablets (4 mg total) by mouth daily as needed for muscle spasms.   No facility-administered encounter medications on file as of 06/14/2021.    Follow-up: No follow-ups on file.   [name], MD

## 2021-06-14 NOTE — Progress Notes (Signed)
Patient is her for CPE. Patient c/o bladder getting worst. Patient c/o getting up 7xs a night. Patient would like to talk with provider about.

## 2021-06-15 ENCOUNTER — Encounter: Payer: Self-pay | Admitting: Family Medicine

## 2021-06-15 ENCOUNTER — Other Ambulatory Visit: Payer: Self-pay | Admitting: Family Medicine

## 2021-06-15 LAB — CBC WITH DIFFERENTIAL/PLATELET
Basophils Absolute: 0 10*3/uL (ref 0.0–0.2)
Basos: 1 %
EOS (ABSOLUTE): 0.1 10*3/uL (ref 0.0–0.4)
Eos: 3 %
Hematocrit: 41.9 % (ref 34.0–46.6)
Hemoglobin: 13.6 g/dL (ref 11.1–15.9)
Immature Grans (Abs): 0 10*3/uL (ref 0.0–0.1)
Immature Granulocytes: 0 %
Lymphocytes Absolute: 1.6 10*3/uL (ref 0.7–3.1)
Lymphs: 35 %
MCH: 30 pg (ref 26.6–33.0)
MCHC: 32.5 g/dL (ref 31.5–35.7)
MCV: 93 fL (ref 79–97)
Monocytes Absolute: 0.5 10*3/uL (ref 0.1–0.9)
Monocytes: 10 %
Neutrophils Absolute: 2.4 10*3/uL (ref 1.4–7.0)
Neutrophils: 51 %
Platelets: 209 10*3/uL (ref 150–450)
RBC: 4.53 x10E6/uL (ref 3.77–5.28)
RDW: 12.8 % (ref 11.7–15.4)
WBC: 4.6 10*3/uL (ref 3.4–10.8)

## 2021-06-15 LAB — CMP14+EGFR
ALT: 16 IU/L (ref 0–32)
AST: 19 IU/L (ref 0–40)
Albumin/Globulin Ratio: 1.6 (ref 1.2–2.2)
Albumin: 4.3 g/dL (ref 3.8–4.8)
Alkaline Phosphatase: 142 IU/L — ABNORMAL HIGH (ref 44–121)
BUN/Creatinine Ratio: 19 (ref 12–28)
BUN: 14 mg/dL (ref 8–27)
Bilirubin Total: 0.5 mg/dL (ref 0.0–1.2)
CO2: 23 mmol/L (ref 20–29)
Calcium: 10.4 mg/dL — ABNORMAL HIGH (ref 8.7–10.3)
Chloride: 107 mmol/L — ABNORMAL HIGH (ref 96–106)
Creatinine, Ser: 0.73 mg/dL (ref 0.57–1.00)
Globulin, Total: 2.7 g/dL (ref 1.5–4.5)
Glucose: 76 mg/dL (ref 70–99)
Potassium: 4.5 mmol/L (ref 3.5–5.2)
Sodium: 145 mmol/L — ABNORMAL HIGH (ref 134–144)
Total Protein: 7 g/dL (ref 6.0–8.5)
eGFR: 91 mL/min/{1.73_m2} (ref >59)

## 2021-06-15 LAB — VITAMIN D 25 HYDROXY (VIT D DEFICIENCY, FRACTURES): Vit D, 25-Hydroxy: 22.8 ng/mL — ABNORMAL LOW (ref 30.0–100.0)

## 2021-06-15 LAB — LIPID PANEL
Chol/HDL Ratio: 3.7 ratio (ref 0.0–4.4)
Cholesterol, Total: 205 mg/dL — ABNORMAL HIGH (ref 100–199)
HDL: 55 mg/dL (ref >39)
LDL Chol Calc (NIH): 129 mg/dL — ABNORMAL HIGH (ref 0–99)
Triglycerides: 118 mg/dL (ref 0–149)
VLDL Cholesterol Cal: 21 mg/dL (ref 5–40)

## 2021-06-15 LAB — HEMOGLOBIN A1C

## 2021-06-15 LAB — TSH: TSH: 0.684 u[IU]/mL (ref 0.450–4.500)

## 2021-06-15 MED ORDER — VITAMIN D (ERGOCALCIFEROL) 1.25 MG (50000 UNIT) PO CAPS: 50000.0000 [IU] | ORAL_CAPSULE | ORAL | 1 refills | Status: DC

## 2021-06-16 LAB — HEMOGLOBIN A1C
Est. average glucose Bld gHb Est-mCnc: 105 mg/dL
Hgb A1c MFr Bld: 5.3 % (ref 4.8–5.6)

## 2021-06-16 LAB — SPECIMEN STATUS REPORT

## 2021-06-29 ENCOUNTER — Encounter (INDEPENDENT_AMBULATORY_CARE_PROVIDER_SITE_OTHER): Payer: Self-pay

## 2021-06-29 ENCOUNTER — Encounter: Payer: Self-pay | Admitting: Family Medicine

## 2021-06-29 ENCOUNTER — Ambulatory Visit (INDEPENDENT_AMBULATORY_CARE_PROVIDER_SITE_OTHER): Payer: Medicare HMO | Admitting: Family Medicine

## 2021-06-29 ENCOUNTER — Other Ambulatory Visit: Payer: Self-pay

## 2021-06-29 VITALS — BP 133/89 | HR 76 | Temp 98.1°F | Resp 16 | Wt 234.2 lb

## 2021-06-29 DIAGNOSIS — Z6838 Body mass index (BMI) 38.0-38.9, adult: Secondary | ICD-10-CM

## 2021-06-29 MED ORDER — PHENTERMINE HCL 37.5 MG PO TABS: 37.5000 mg | ORAL_TABLET | Freq: Every day | ORAL | 0 refills | Status: DC

## 2021-06-29 NOTE — Progress Notes (Signed)
Patient is and is asking for something for weight loss. Patient has been walking and trying to keep weight off herself. Patient would like to talk with provider about this issues

## 2021-06-29 NOTE — Progress Notes (Signed)
Established Patient Office Visit  Subjective:  Patient ID: Jasmine Estrada, female    DOB: 1956/02/28  Age: 66 y.o. MRN: [id number]  CC:  Chief Complaint  Patient presents with   Weight Loss    HPI [name] presents for desiring weight loss.  Past Medical History:  Diagnosis Date   Arthritis    legs   Asthma    prn inhaler   Cough 07/15/2012   Dyspnea    Essential and other specified forms of tremor 01/26/2013   Full dentures    Mass of knee 07/2012   left   Memory difficulty 01/26/2013   Obesity    Overactive bladder 2019   Sleep apnea    Stroke [facility])    right sided-weakness   Tremor    right foot   Urinary frequency     Past Surgical History:  Procedure Laterality Date   ACHILLES TENDON LENGTHENING Right 02/25/2015   BOTOX INJECTION N/A 11/19/2017   Procedure: CYSTOSCOPY BOTOX INJECTION;  Surgeon: Bjorn Loser, MD;  Location: [facility];  Service: Urology;  Laterality: N/A;   CATARACT EXTRACTION Bilateral 2020   IRIDOTOMY / IRIDECTOMY Bilateral    JOINT REPLACEMENT     MASS EXCISION Left 07/21/2012   Procedure: EXCISION OF LARGE MASS LEFT KNEE WITH LIPOSUCTION ASSISTED;  Surgeon: Cristine Polio, MD;  Location: [place];  Service: Plastics;  Laterality: Left;   TOTAL KNEE ARTHROPLASTY Right 09/24/2019   Procedure: TOTAL KNEE ARTHROPLASTY;  Surgeon: Paralee Cancel, MD;  Location: [facility];  Service: Orthopedics;  Laterality: Right;  71mns   TUBAL LIGATION  1982    Family History  Problem Relation Age of Onset   Breast cancer Sister    Cancer Sister    Diabetes Sister    Mental illness Sister    Alzheimer's disease Maternal Aunt    Alzheimer's disease Maternal Grandmother    Breast cancer Cousin    Mental illness Brother     Social History   Socioeconomic History   Marital status: Divorced    Spouse name: Not on file   Number of children: 4   Years of education: HS   Highest education level: Not on file   Occupational History   Not on file  Tobacco Use   Smoking status: Every Day    Packs/day: 0.25    Years: 52.00    Pack years: 13.00    Types: Cigarettes   Smokeless tobacco: Never   Tobacco comments:    1 pack every 4 days  Vaping Use   Vaping Use: Never used  Substance and Sexual Activity   Alcohol use: Yes    Comment: rare   Drug use: No   Sexual activity: Not on file  Other Topics Concern   Not on file  Social History Narrative   Not on file   Social Determinants of Health   Financial Resource Strain: Not on file  Food Insecurity: Not on file  Transportation Needs: Not on file  Physical Activity: Not on file  Stress: Not on file  Social Connections: Not on file  Intimate Partner Violence: Not on file    ROS Review of Systems  All other systems reviewed and are negative.  Objective:   Today's Vitals: BP 133/89   Pulse 76   Temp 98.1 F (36.7 C) (Oral)   Resp 16   Wt 234 lb 3.2 oz (106.2 kg)   SpO2 96%   BMI 38.09 kg/m  Physical Exam Vitals and nursing note reviewed.  Constitutional:      General: She is not in acute distress.    Appearance: She is obese.  Cardiovascular:     Rate and Rhythm: Normal rate and regular rhythm.  Pulmonary:     Effort: Pulmonary effort is normal.     Breath sounds: Normal breath sounds.  Abdominal:     Palpations: Abdomen is soft.     Tenderness: There is no abdominal tenderness.  Neurological:     General: No focal deficit present.     Mental Status: She is alert and oriented to person, place, and time.    Assessment & Plan:   1. Class 2 severe obesity due to excess calories with serious comorbidity and body mass index (BMI) of 38.0 to 38.9 in adult [facility]) Discussed dietary and activity options. Phentermine prescribed. Monitor   Outpatient Encounter Medications as of 06/29/2021  Medication Sig   albuterol (PROVENTIL HFA;VENTOLIN HFA) 108 (90 BASE) MCG/ACT inhaler Inhale 2 puffs into the lungs every 6 (six)  hours as needed for wheezing.   aspirin 81 MG chewable tablet aspirin 81 mg chewable tablet  CHEW 1 TABLET TWICE A DAY BY ORAL ROUTE FOR 30 DAYS.   atorvastatin (LIPITOR) 40 MG tablet Take 1 tablet (40 mg total) by mouth daily.   celecoxib (CELEBREX) 200 MG capsule celecoxib 200 mg capsule  TAKE 1 CAPSULE BY MOUTH EVERY DAY FOR OSTEOARTHRITIS   FLOVENT HFA 220 MCG/ACT inhaler Inhale 2 puffs into the lungs in the morning and at bedtime.    NEUPRO 1 MG/24HR PT24 Apply 1 patch topically daily.    oxybutynin (DITROPAN XL) 15 MG 24 hr tablet    oxybutynin (DITROPAN) 5 MG tablet Take 5 mg by mouth 2 (two) times daily.   primidone (MYSOLINE) 50 MG tablet Take 25 mg by mouth at bedtime.    tiZANidine (ZANAFLEX) 2 MG tablet Take 2 tablets (4 mg total) by mouth daily as needed for muscle spasms.   Vitamin D, Ergocalciferol, (DRISDOL) 1.25 MG (50000 UNIT) CAPS capsule Take 1 capsule (50,000 Units total) by mouth every 7 (seven) days.   Ergocalciferol (VITAMIN D2) 10 MCG (400 UNIT) TABS    No facility-administered encounter medications on file as of 06/29/2021.    Follow-up: No follow-ups on file.   [name], MD

## 2021-07-10 ENCOUNTER — Telehealth: Payer: Self-pay | Admitting: Family Medicine

## 2021-07-10 NOTE — Telephone Encounter (Signed)
Copied from [place] [phone]. Topic: General - Other >> Jul 10, 2021  9:36 AM Holley Dexter N wrote: Reason for CRM: Pt called in stating she is suppose to be having a "Lap Band" Surgery and wanting to know from PCP if she will need to do anything else, pt requesting a call back.

## 2021-07-11 NOTE — Telephone Encounter (Signed)
Patient was called and given information pcp advised

## 2021-07-14 ENCOUNTER — Ambulatory Visit (INDEPENDENT_AMBULATORY_CARE_PROVIDER_SITE_OTHER): Payer: Medicare HMO

## 2021-07-14 VITALS — Ht 66.0 in | Wt 228.0 lb

## 2021-07-14 DIAGNOSIS — Z0000 Encounter for general adult medical examination without abnormal findings: Secondary | ICD-10-CM | POA: Diagnosis not present

## 2021-07-14 DIAGNOSIS — Z6836 Body mass index (BMI) 36.0-36.9, adult: Secondary | ICD-10-CM | POA: Diagnosis not present

## 2021-07-14 DIAGNOSIS — Z1211 Encounter for screening for malignant neoplasm of colon: Secondary | ICD-10-CM

## 2021-07-14 DIAGNOSIS — Z1159 Encounter for screening for other viral diseases: Secondary | ICD-10-CM

## 2021-07-14 NOTE — Progress Notes (Signed)
Subjective:   Jasmine Estrada is a 65 y.o. female who presents for an Initial Medicare Annual Wellness Visit.    Objective:    Today's Vitals   07/14/21 1220 07/14/21 1222  Weight: 228 lb (103.4 kg)   Height: '5\' 6"'$  (1.676 m)   PainSc:  10-Worst pain ever   Body mass index is 36.8 kg/m.     09/24/2019   11:05 AM 09/18/2019    8:50 AM 11/19/2017    8:25 AM 04/23/2016    9:13 AM 07/15/2012    1:54 PM  Advanced Directives  Does Patient Have a Medical Advance Directive? Yes Yes No No Patient does not have advance directive;Patient would not like information  Type of Scientist, forensic Power of [place];Living will [place];Living will     Does patient want to make changes to medical advance directive? No - Patient declined      Copy of [place] in Chart? No - copy requested      Would patient like information on creating a medical advance directive?   Yes (MAU/Ambulatory/Procedural Areas - Information given) No - Patient declined     Current Medications (verified) Outpatient Encounter Medications as of 07/14/2021  Medication Sig   albuterol (PROVENTIL HFA;VENTOLIN HFA) 108 (90 BASE) MCG/ACT inhaler Inhale 2 puffs into the lungs every 6 (six) hours as needed for wheezing.   aspirin 81 MG chewable tablet aspirin 81 mg chewable tablet  CHEW 1 TABLET TWICE A DAY BY ORAL ROUTE FOR 30 DAYS.   atorvastatin (LIPITOR) 40 MG tablet Take 1 tablet (40 mg total) by mouth daily.   celecoxib (CELEBREX) 200 MG capsule celecoxib 200 mg capsule  TAKE 1 CAPSULE BY MOUTH EVERY DAY FOR OSTEOARTHRITIS   DULoxetine (CYMBALTA) 20 MG capsule 1 capsule   Ergocalciferol (VITAMIN D2) 10 MCG (400 UNIT) TABS    FLOVENT HFA 220 MCG/ACT inhaler Inhale 2 puffs into the lungs in the morning and at bedtime.    NEUPRO 1 MG/24HR PT24 Apply 1 patch topically daily.    oxybutynin (DITROPAN) 5 MG tablet Take 10 mg by mouth daily at 6 (six) AM.   phentermine (ADIPEX-P) 37.5 MG  tablet Take 1 tablet (37.5 mg total) by mouth daily before breakfast.   primidone (MYSOLINE) 50 MG tablet Take 25 mg by mouth at bedtime.    tiZANidine (ZANAFLEX) 2 MG tablet Take 2 tablets (4 mg total) by mouth daily as needed for muscle spasms.   Vitamin D, Ergocalciferol, (DRISDOL) 1.25 MG (50000 UNIT) CAPS capsule Take 1 capsule (50,000 Units total) by mouth every 7 (seven) days.   [DISCONTINUED] oxybutynin (DITROPAN XL) 15 MG 24 hr tablet  (Patient not taking: Reported on 07/14/2021)   No facility-administered encounter medications on file as of 07/14/2021.    Allergies (verified) Patient has no known allergies.   History: Past Medical History:  Diagnosis Date   Arthritis    legs   Asthma    prn inhaler   Cough 07/15/2012   Dyspnea    Essential and other specified forms of tremor 01/26/2013   Full dentures    Mass of knee 07/2012   left   Memory difficulty 01/26/2013   Obesity    Overactive bladder 2019   Sleep apnea    Stroke [facility])    right sided-weakness   Tremor    right foot   Urinary frequency    Past Surgical History:  Procedure Laterality Date   ACHILLES TENDON LENGTHENING  Right 02/25/2015   BOTOX INJECTION N/A 11/19/2017   Procedure: CYSTOSCOPY BOTOX INJECTION;  Surgeon: Bjorn Loser, MD;  Location: [facility];  Service: Urology;  Laterality: N/A;   CATARACT EXTRACTION Bilateral 2020   IRIDOTOMY / IRIDECTOMY Bilateral    JOINT REPLACEMENT     MASS EXCISION Left 07/21/2012   Procedure: EXCISION OF LARGE MASS LEFT KNEE WITH LIPOSUCTION ASSISTED;  Surgeon: Cristine Polio, MD;  Location: [place];  Service: Plastics;  Laterality: Left;   TOTAL KNEE ARTHROPLASTY Right 09/24/2019   Procedure: TOTAL KNEE ARTHROPLASTY;  Surgeon: Paralee Cancel, MD;  [facility];  Service: Orthopedics;  Laterality: Right;  18mns   TUBAL LIGATION  1982   Family History  Problem Relation Age of Onset   Breast cancer Sister    Cancer Sister     Diabetes Sister    Mental illness Sister    Alzheimer's disease Maternal Aunt    Alzheimer's disease Maternal Grandmother    Breast cancer Cousin    Mental illness Brother    Social History   Socioeconomic History   Marital status: Divorced    Spouse name: Not on file   Number of children: 4   Years of education: HS   Highest education level: Not on file  Occupational History   Not on file  Tobacco Use   Smoking status: Every Day    Packs/day: 0.25    Years: 52.00    Pack years: 13.00    Types: Cigarettes   Smokeless tobacco: Never   Tobacco comments:    1 pack every 4 days  Vaping Use   Vaping Use: Never used  Substance and Sexual Activity   Alcohol use: Yes    Comment: rare   Drug use: No   Sexual activity: Not on file  Other Topics Concern   Not on file  Social History Narrative   Not on file   Social Determinants of Health   Financial Resource Strain: Low Risk    Difficulty of Paying Living Expenses: Not hard at all  Food Insecurity: No Food Insecurity   Worried About RCharity fundraiserin the Last Year: Never true   Ran Out of Food in the Last Year: Never true  Transportation Needs: No Transportation Needs   Lack of Transportation (Medical): No   Lack of Transportation (Non-Medical): No  Physical Activity: Sufficiently Active   Days of Exercise per Week: 5 days   Minutes of Exercise per Session: 30 min  Stress: No Stress Concern Present   Feeling of Stress : Not at all  Social Connections: Socially Isolated   Frequency of Communication with Friends and Family: More than three times a week   Frequency of Social Gatherings with Friends and Family: Never   Attends Religious Services: Never   AMarine scientistor Organizations: No   Attends CMusic therapist Never   Marital Status: Divorced    Tobacco Counseling Ready to quit: Not Answered Counseling given: Not Answered Tobacco comments: 1 pack every 4 days   Clinical  Intake:  Pre-visit preparation completed: Yes  Pain : 0-10 Pain Score: 10-Worst pain ever Pain Type: Chronic pain Pain Location: Leg Pain Orientation: Right Pain Descriptors / Indicators: Shooting, Throbbing Pain Onset: More than a month ago Pain Frequency: Constant     Diabetes: No  How often do you need to have someone help you when you read instructions, pamphlets, or other written materials from your doctor or  pharmacy?: 3 - Sometimes What is the last grade level you completed in school?: 12th grade  Diabetic?no  Interpreter Needed?: No      Activities of Daily Living     View : No data to display.          Patient Care Team: Dorna Mai, MD as PCP - General (Family Medicine) Bronson Ing, DPM as Consulting Physician (Podiatry)  Indicate any recent Medical Services you may have received from other than [facility] providers in the past year (date may be approximate).     Assessment:   This is a routine wellness examination for Fe[place].  Hearing/Vision screen No results found.  Dietary issues and exercise activities discussed:     Goals Addressed             This Visit's Progress    Weight (lb) < 200 lb (90.7 kg)   228 lb (103.4 kg)    Patient stated she wants to eat healthier and lose about 50lbs. She reports she is exercising.      Depression Screen    07/14/2021   12:28 PM 06/29/2021    9:51 AM 03/20/2021    9:48 AM 04/23/2016    9:13 AM  PHQ 2/9 Scores  PHQ - 2 Score 0 0 0 0  PHQ- 9 Score 0 1 0     Fall Risk    06/14/2021   11:24 AM 04/23/2016    9:13 AM  [place] in the past year? 0 No  Number falls in past yr: 0   Injury with Fall? 0     FALL RISK PREVENTION PERTAINING TO THE HOME:  Any stairs in or around the home? No  If so, are there any without handrails? No  Home free of loose throw rugs in walkways, pet beds, electrical cords, etc? No  Adequate lighting in your home to reduce risk of falls? No   ASSISTIVE  DEVICES UTILIZED TO PREVENT FALLS:  Life alert? Yes  Use of a cane, walker or w/c? Yes  Grab bars in the bathroom? Yes  Shower chair or bench in shower? Yes  Elevated toilet seat or a handicapped toilet? No   Cognitive Function:        Immunizations Immunization History  Administered Date(s) Administered   Moderna Sars-Covid-2 Vaccination 06/20/2019, 07/21/2019, 06/28/2020    TDAP status: Due, Education has been provided regarding the importance of this vaccine. Advised may receive this vaccine at local pharmacy or Health Dept. Aware to provide a copy of the vaccination record if obtained from local pharmacy or Health Dept. Verbalized acceptance and understanding.  Flu Vaccine status: Declined, Education has been provided regarding the importance of this vaccine but patient still declined. Advised may receive this vaccine at local pharmacy or Health Dept. Aware to provide a copy of the vaccination record if obtained from local pharmacy or Health Dept. Verbalized acceptance and understanding.  Pneumococcal vaccine status: Declined,  Education has been provided regarding the importance of this vaccine but patient still declined. Advised may receive this vaccine at local pharmacy or Health Dept. Aware to provide a copy of the vaccination record if obtained from local pharmacy or Health Dept. Verbalized acceptance and understanding.   Covid-19 vaccine status: Completed vaccines  Qualifies for Shingles Vaccine? Yes   Zostavax completed No   Shingrix Completed?: No.    Education has been provided regarding the importance of this vaccine. Patient has been advised to call insurance company to determine  out of pocket expense if they have not yet received this vaccine. Advised may also receive vaccine at local pharmacy or Health Dept. Verbalized acceptance and understanding.  Screening Tests Health Maintenance  Topic Date Due   Hepatitis C Screening  Never done   PAP SMEAR-Modifier  Never  done   INFLUENZA VACCINE  07/14/2021 (Originally 11/14/2020)   Zoster Vaccines- Shingrix (1 of 2) 09/14/2021 (Originally 04/18/2006)   TETANUS/TDAP  04/15/2022 (Originally 04/19/1975)   COVID-19 Vaccine (4 - Booster for Moderna series) 04/15/2022 (Originally 08/23/2020)   Pneumonia Vaccine 53+ Years old (1 - PCV) 06/15/2022 (Originally 04/18/1962)   COLONOSCOPY (Pts 45-44yr Insurance coverage will need to be confirmed)  06/15/2022 (Originally 04/18/2001)   MAMMOGRAM  02/25/2023   DEXA SCAN  Completed   HIV Screening  Completed   HPV VACCINES  Aged Out    Health Maintenance  Health Maintenance Due  Topic Date Due   Hepatitis C Screening  Never done   PAP SMEAR-Modifier  Never done    Colorectal cancer screening: Referral to GI placed  . Pt aware the office will call re: appt.  Mammogram status: Completed 02/24/2021. Repeat every year  Bone Density status: Completed 06/08/21. Results reflect: Bone density results: OSTEOPENIA. Repeat every 2 years.  Lung Cancer Screening: (Low Dose CT Chest recommended if Age 65-80years, 30 pack-year currently smoking OR have quit w/in 15years.) does qualify.   Lung Cancer Screening Referral: yes  Additional Screening:  Hepatitis C Screening: does qualify; Completed n.a  Vision Screening: Recommended annual ophthalmology exams for early detection of glaucoma and other disorders of the eye. Is the patient up to date with their annual eye exam?  Yes  Who is the provider or what is the name of the office in which the patient attends annual eye exams? Pt does not remember the name of the eye doctor she goes too.  If pt is not established with a provider, would they like to be referred to a provider to establish care? Yes .   Dental Screening: Recommended annual dental exams for proper oral hygiene  Community Resource Referral / Chronic Care Management: CRR required this visit?  No   CCM required this visit?  No      Plan:     I have personally  reviewed and noted the following in the patient's chart:   Medical and social history Use of alcohol, tobacco or illicit drugs  Current medications and supplements including opioid prescriptions. Patient is not currently taking opioid prescriptions. Functional ability and status Nutritional status Physical activity Advanced directives List of other physicians Hospitalizations, surgeries, and ER visits in previous 12 months Vitals Screenings to include cognitive, depression, and falls Referrals and appointments  In addition, I have reviewed and discussed with patient certain preventive protocols, quality metrics, and best practice recommendations. A written personalized care plan for preventive services as well as general preventive health recommendations were provided to patient.     [place] CMA   07/14/2021   Nurse Notes: Patient would like to be screened for hep c during her next visit.

## 2021-07-27 ENCOUNTER — Encounter: Payer: Self-pay | Admitting: Sports Medicine

## 2021-07-27 ENCOUNTER — Ambulatory Visit (INDEPENDENT_AMBULATORY_CARE_PROVIDER_SITE_OTHER): Payer: Medicare HMO | Admitting: Sports Medicine

## 2021-07-27 DIAGNOSIS — M79672 Pain in left foot: Secondary | ICD-10-CM

## 2021-07-27 DIAGNOSIS — L84 Corns and callosities: Secondary | ICD-10-CM

## 2021-07-27 DIAGNOSIS — M79674 Pain in right toe(s): Secondary | ICD-10-CM | POA: Diagnosis not present

## 2021-07-27 DIAGNOSIS — M79675 Pain in left toe(s): Secondary | ICD-10-CM | POA: Diagnosis not present

## 2021-07-27 DIAGNOSIS — M79671 Pain in right foot: Secondary | ICD-10-CM

## 2021-07-27 DIAGNOSIS — B351 Tinea unguium: Secondary | ICD-10-CM

## 2021-07-27 DIAGNOSIS — R251 Tremor, unspecified: Secondary | ICD-10-CM

## 2021-07-27 NOTE — Progress Notes (Signed)
Subjective: Jasmine Estrada is a 66 y.o. female patient seen today in office for follow up evaluation and routine nail care.  Patient reports PCP visit in Dec.  No changes from prior.  Denies any other concerns at this time.  Patient Active Problem List   Diagnosis Date Noted   Obese 09/25/2019   Right knee OA 09/25/2019   S/P right TKA 09/24/2019   Pain in left knee 07/29/2019   Pain in right knee 07/29/2019   Ankle edema 04/12/2015   Status post right foot surgery 03/02/2015   Equinus deformity of foot, acquired 02/02/2015   Tenosynovitis of ankle 01/03/2015   Posterior tibial tendon dysfunction 01/03/2015   Onychomycosis 01/03/2015   Urge urinary incontinence 11/11/2014   Hematuria, microscopic 09/23/2014   OAB (overactive bladder) 09/23/2014   Lumbago 08/12/2013   Pain in limb 05/25/2013   Essential and other specified forms of tremor 01/26/2013   Memory difficulty 01/26/2013    Current Outpatient Medications on File Prior to Visit  Medication Sig Dispense Refill   citalopram (CELEXA) 10 MG tablet Take by mouth.     DULoxetine (CYMBALTA) 30 MG capsule 1 po qam x7 days, then increase to 2 po qam     ondansetron (ZOFRAN) 4 MG tablet Take by mouth.     Vibegron (GEMTESA) 75 MG TABS Take 1 tablet by mouth daily.     Acetaminophen-Codeine 300-30 MG tablet Take by mouth.     albuterol (PROVENTIL HFA;VENTOLIN HFA) 108 (90 BASE) MCG/ACT inhaler Inhale 2 puffs into the lungs every 6 (six) hours as needed for wheezing.     aspirin 81 MG chewable tablet aspirin 81 mg chewable tablet  CHEW 1 TABLET TWICE A DAY BY ORAL ROUTE FOR 30 DAYS.     atorvastatin (LIPITOR) 40 MG tablet Take 1 tablet (40 mg total) by mouth daily. 90 tablet 1   busPIRone (BUSPAR) 10 MG tablet Take by mouth.     celecoxib (CELEBREX) 200 MG capsule celecoxib 200 mg capsule  TAKE 1 CAPSULE BY MOUTH EVERY DAY FOR OSTEOARTHRITIS 90 capsule 1   celecoxib (CELEBREX) 200 MG capsule 1 capsule with food     diclofenac  (VOLTAREN) 75 MG EC tablet Take by mouth.     DULoxetine (CYMBALTA) 20 MG capsule 1 capsule     Ergocalciferol (VITAMIN D2) 10 MCG (400 UNIT) TABS      FLOVENT HFA 220 MCG/ACT inhaler Inhale 2 puffs into the lungs in the morning and at bedtime.      furosemide (LASIX) 20 MG tablet Take by mouth.     gabapentin (NEURONTIN) 300 MG capsule Take by mouth.     hydrOXYzine (ATARAX) 25 MG tablet Take by mouth.     ibuprofen (ADVIL) 800 MG tablet Take by mouth.     methocarbamol (ROBAXIN) 500 MG tablet methocarbamol 500 mg tablet  TAKE 1 TABLET BY MOUTH EVERY 6 HOURS AS NEEDED SPASMS     NEUPRO 1 MG/24HR PT24 Apply 1 patch topically daily.      nitrofurantoin, macrocrystal-monohydrate, (MACROBID) 100 MG capsule Take by mouth.     oxybutynin (DITROPAN) 5 MG tablet Take 10 mg by mouth daily at 6 (six) AM.     oxybutynin (DITROPAN-XL) 10 MG 24 hr tablet Take by mouth.     oxybutynin (DITROPAN-XL) 5 MG 24 hr tablet Take 5 mg by mouth daily.     phentermine (ADIPEX-P) 37.5 MG tablet Take 1 tablet (37.5 mg total) by mouth daily before breakfast. 30 tablet  0   phentermine 37.5 MG capsule Take by mouth.     pregabalin (LYRICA) 75 MG capsule Take by mouth.     PREMARIN vaginal cream Place vaginally.     primidone (MYSOLINE) 50 MG tablet Take 25 mg by mouth at bedtime.      tiZANidine (ZANAFLEX) 2 MG tablet Take 2 tablets (4 mg total) by mouth daily as needed for muscle spasms. 30 tablet 1   tiZANidine (ZANAFLEX) 4 MG tablet 1 tablet as needed     Vitamin D, Ergocalciferol, (DRISDOL) 1.25 MG (50000 UNIT) CAPS capsule Take 1 capsule (50,000 Units total) by mouth every 7 (seven) days. 12 capsule 1   No current facility-administered medications on file prior to visit.    No Known Allergies  Objective: Physical Exam  General: Well developed, nourished, no acute distress, awake, alert and oriented x 3   Vascular: Dorsalis pedis artery 2/4 bilateral, Posterior tibial artery 1/4 bilateral, skin temperature  warm to warm proximal to distal bilateral lower extremities, no varicosities, pedal hair present bilateral.   Neurological: Gross sensation present via light touch bilateral. Uncontrolled twitches of all toes as previously noted.    Dermatological: Skin is warm, dry, and supple bilateral, Nails 1-10 are tender, short thick, and discolored with mild subungal debris, no webspace macerations present bilateral, no open lesions present bilateral, minimal callus noted to left 1st toe.  Dry skin bilateral.  No signs of infection bilateral.   Musculoskeletal: + Pes planus and hammertoe and bunion boney deformities noted bilateral. Muscular strength 4/5 without painon range of motion. No pain with calf compression bilateral.  Assessment and Plan:  Problem List Items Addressed This Visit   None Visit Diagnoses     Pain due to onychomycosis of toenails of both feet    -  Primary   Relevant Medications   nitrofurantoin, macrocrystal-monohydrate, (MACROBID) 100 MG capsule   Tremor       Callus of foot       Bilateral foot pain           -Examined patient -Mechanically debrided painful nails x 10 using sterile nail nipper without incident  -Continue with PCP follow-up -Smooth any rough areas of skin at medial first toes with rotary bur at no additional charge without incident -Patient to return in for nail care as needed or sooner if problems or issues arise  Landis Martins, DPM

## 2021-08-03 ENCOUNTER — Encounter: Payer: Self-pay | Admitting: Family Medicine

## 2021-08-03 ENCOUNTER — Ambulatory Visit (INDEPENDENT_AMBULATORY_CARE_PROVIDER_SITE_OTHER): Payer: Medicare HMO | Admitting: Family Medicine

## 2021-08-03 VITALS — BP 115/79 | HR 82 | Temp 98.1°F | Resp 16 | Wt 226.0 lb

## 2021-08-03 DIAGNOSIS — M1990 Unspecified osteoarthritis, unspecified site: Secondary | ICD-10-CM | POA: Insufficient documentation

## 2021-08-03 DIAGNOSIS — F418 Other specified anxiety disorders: Secondary | ICD-10-CM | POA: Diagnosis not present

## 2021-08-03 DIAGNOSIS — Z6836 Body mass index (BMI) 36.0-36.9, adult: Secondary | ICD-10-CM | POA: Diagnosis not present

## 2021-08-03 DIAGNOSIS — I10 Essential (primary) hypertension: Secondary | ICD-10-CM | POA: Insufficient documentation

## 2021-08-03 DIAGNOSIS — R32 Unspecified urinary incontinence: Secondary | ICD-10-CM | POA: Insufficient documentation

## 2021-08-03 DIAGNOSIS — J45909 Unspecified asthma, uncomplicated: Secondary | ICD-10-CM | POA: Insufficient documentation

## 2021-08-03 DIAGNOSIS — K760 Fatty (change of) liver, not elsewhere classified: Secondary | ICD-10-CM | POA: Insufficient documentation

## 2021-08-03 DIAGNOSIS — E785 Hyperlipidemia, unspecified: Secondary | ICD-10-CM | POA: Insufficient documentation

## 2021-08-03 DIAGNOSIS — E559 Vitamin D deficiency, unspecified: Secondary | ICD-10-CM | POA: Insufficient documentation

## 2021-08-03 DIAGNOSIS — Z780 Asymptomatic menopausal state: Secondary | ICD-10-CM | POA: Insufficient documentation

## 2021-08-03 MED ORDER — DIAZEPAM 10 MG PO TABS: 10.0000 mg | ORAL_TABLET | Freq: Two times a day (BID) | ORAL | 0 refills | Status: DC | PRN

## 2021-08-03 MED ORDER — PHENTERMINE HCL 37.5 MG PO TABS: 37.5000 mg | ORAL_TABLET | Freq: Every day | ORAL | 0 refills | Status: DC

## 2021-08-03 NOTE — Progress Notes (Signed)
Patient is her for follow-up weight loss.Pt is walking 68mn a day and trying to keep her weight down . Patient has lost a few lbs.

## 2021-08-04 ENCOUNTER — Encounter: Payer: Self-pay | Admitting: Family Medicine

## 2021-08-04 NOTE — Progress Notes (Signed)
Established Patient Office Visit  Subjective    Patient ID: Jasmine Estrada, female    DOB: 1955-12-19  Age: 66 y.o. MRN: [id number]  CC:  Chief Complaint  Patient presents with   Weight Check    HPI Jasmine Estrada presents for follow up of obesity. She reports that she has been doing well with phentermine, diet and exercise. She also reports that she will be travelling by plane in a few weeks and she would like something to help calm her down.    Outpatient Encounter Medications as of 08/03/2021  Medication Sig   Acetaminophen-Codeine 300-30 MG tablet Take by mouth.   albuterol (PROVENTIL HFA;VENTOLIN HFA) 108 (90 BASE) MCG/ACT inhaler Inhale 2 puffs into the lungs every 6 (six) hours as needed for wheezing.   aspirin 81 MG chewable tablet aspirin 81 mg chewable tablet  CHEW 1 TABLET TWICE A DAY BY ORAL ROUTE FOR 30 DAYS.   atorvastatin (LIPITOR) 40 MG tablet Take 1 tablet (40 mg total) by mouth daily.   busPIRone (BUSPAR) 10 MG tablet Take by mouth.   celecoxib (CELEBREX) 200 MG capsule celecoxib 200 mg capsule  TAKE 1 CAPSULE BY MOUTH EVERY DAY FOR OSTEOARTHRITIS   celecoxib (CELEBREX) 200 MG capsule 1 capsule with food   citalopram (CELEXA) 10 MG tablet Take by mouth.   diazepam (VALIUM) 10 MG tablet Take 1 tablet (10 mg total) by mouth every 12 (twelve) hours as needed for anxiety (Take 1 po 30 minutes before flight).   diclofenac (VOLTAREN) 75 MG EC tablet Take by mouth.   DULoxetine (CYMBALTA) 20 MG capsule 1 capsule   DULoxetine (CYMBALTA) 30 MG capsule 1 po qam x7 days, then increase to 2 po qam   Ergocalciferol (VITAMIN D2) 10 MCG (400 UNIT) TABS    FLOVENT HFA 220 MCG/ACT inhaler Inhale 2 puffs into the lungs in the morning and at bedtime.    furosemide (LASIX) 20 MG tablet Take by mouth.   gabapentin (NEURONTIN) 300 MG capsule Take by mouth.   hydrOXYzine (ATARAX) 25 MG tablet Take by mouth.   ibuprofen (ADVIL) 800 MG tablet Take by mouth.   methocarbamol (ROBAXIN)  500 MG tablet methocarbamol 500 mg tablet  TAKE 1 TABLET BY MOUTH EVERY 6 HOURS AS NEEDED SPASMS   NEUPRO 1 MG/24HR PT24 Apply 1 patch topically daily.    nitrofurantoin, macrocrystal-monohydrate, (MACROBID) 100 MG capsule Take by mouth.   ondansetron (ZOFRAN) 4 MG tablet Take by mouth.   oxybutynin (DITROPAN) 5 MG tablet Take 10 mg by mouth daily at 6 (six) AM.   oxybutynin (DITROPAN-XL) 10 MG 24 hr tablet Take by mouth.   oxybutynin (DITROPAN-XL) 5 MG 24 hr tablet Take 5 mg by mouth daily.   phentermine 37.5 MG capsule Take by mouth.   pregabalin (LYRICA) 75 MG capsule Take by mouth.   PREMARIN vaginal cream Place vaginally.   primidone (MYSOLINE) 50 MG tablet Take 25 mg by mouth at bedtime.    tiZANidine (ZANAFLEX) 2 MG tablet Take 2 tablets (4 mg total) by mouth daily as needed for muscle spasms.   tiZANidine (ZANAFLEX) 4 MG tablet 1 tablet as needed   Vibegron (GEMTESA) 75 MG TABS Take 1 tablet by mouth daily.   Vitamin D, Ergocalciferol, (DRISDOL) 1.25 MG (50000 UNIT) CAPS capsule Take 1 capsule (50,000 Units total) by mouth every 7 (seven) days.   [DISCONTINUED] phentermine (ADIPEX-P) 37.5 MG tablet Take 1 tablet (37.5 mg total) by mouth daily before breakfast.  phentermine (ADIPEX-P) 37.5 MG tablet Take 1 tablet (37.5 mg total) by mouth daily before breakfast.   No facility-administered encounter medications on file as of 08/03/2021.    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   Tremor    right foot   Urinary frequency     Past Surgical History:  Procedure Laterality Date   ACHILLES TENDON LENGTHENING Right 02/25/2015   BOTOX INJECTION N/A 11/19/2017   Procedure:  CYSTOSCOPY BOTOX INJECTION;  Surgeon: Bjorn Loser, MD;  Location: [facility];  Service: Urology;  Laterality: N/A;   CATARACT EXTRACTION Bilateral 2020   IRIDOTOMY / IRIDECTOMY Bilateral    JOINT REPLACEMENT     MASS EXCISION Left 07/21/2012   Procedure: EXCISION OF LARGE MASS LEFT KNEE WITH LIPOSUCTION ASSISTED;  Surgeon: Cristine Polio, MD;  Location: [facility];  Service: Plastics;  Laterality: Left;   TOTAL KNEE ARTHROPLASTY Right 09/24/2019   Procedure: TOTAL KNEE ARTHROPLASTY;  Surgeon: Paralee Cancel, MD;  Location: [facility];  Service: Orthopedics;  Laterality: Right;  29mns   TUBAL LIGATION  1982    Family History  Problem Relation Age of Onset   Breast cancer Sister    Cancer Sister    Diabetes Sister    Mental illness Sister    Alzheimer's disease Maternal Aunt    Alzheimer's disease Maternal Grandmother    Breast cancer Cousin    Mental illness Brother     Social History   Socioeconomic History   Marital status: Divorced    Spouse name: Not on file   Number of children: 4   Years of education: HS   Highest education level: Not on file  Occupational History   Not on file  Tobacco Use   Smoking status: Every Day    Packs/day: 0.25    Years: 52.00    Pack years: 13.00    Types: Cigarettes   Smokeless tobacco: Never   Tobacco comments:    1 pack every 4 days  Vaping Use   Vaping Use: Never used  Substance and Sexual Activity   Alcohol use: Yes    Comment: rare   Drug use: No   Sexual activity: Not on file  Other Topics Concern   Not on file  Social History Narrative   Not on file   Social Determinants of Health   Financial Resource Strain: Low Risk    Difficulty of Paying Living Expenses: Not hard at all  Food Insecurity: No Food Insecurity   Worried About RCharity fundraiserin the Last Year: Never true   Ran Out of Food in the Last Year: Never true  Transportation Needs: No Transportation Needs   Lack of  Transportation (Medical): No   Lack of Transportation (Non-Medical): No  Physical Activity: Sufficiently Active   Days of Exercise per Week: 5 days   Minutes of Exercise per Session: 30 min  Stress: No Stress Concern Present   Feeling of Stress : Not at all  Social Connections: Socially Isolated   Frequency of Communication with Friends and  Family: More than three times a week   Frequency of Social Gatherings with Friends and Family: Never   Attends Religious Services: Never   Marine scientist or Organizations: No   Attends Music therapist: Never   Marital Status: Divorced  Human resources officer Violence: Not At Risk   Fear of Current or Ex-Partner: No   Emotionally Abused: No   Physically Abused: No   Sexually Abused: No    Review of Systems  Psychiatric/Behavioral:  The patient is nervous/anxious.   All other systems reviewed and are negative.      Objective    BP 115/79   Pulse 82   Temp 98.1 F (36.7 C) (Oral)   Resp 16   Wt 226 lb (102.5 kg)   SpO2 95%   BMI 36.48 kg/m   Physical Exam Vitals and nursing note reviewed.  Constitutional:      General: She is not in acute distress.    Appearance: She is obese.  Cardiovascular:     Rate and Rhythm: Normal rate and regular rhythm.  Pulmonary:     Effort: Pulmonary effort is normal.     Breath sounds: Normal breath sounds.  Abdominal:     Palpations: Abdomen is soft.     Tenderness: There is no abdominal tenderness.  Neurological:     General: No focal deficit present.     Mental Status: She is alert and oriented to person, place, and time.        Assessment & Plan:   1. Class 2 severe obesity due to excess calories with serious comorbidity and body mass index (BMI) of 36.0 to 36.9 in adult [facility]) Patient doing well with weight loss. Will refill phentermine and monitor  2. Situational anxiety Valium prescribed for use with airplane flights   No follow-ups on file.   [name],  MD

## 2021-08-14 DIAGNOSIS — K760 Fatty (change of) liver, not elsewhere classified: Secondary | ICD-10-CM | POA: Diagnosis not present

## 2021-08-14 DIAGNOSIS — Z6836 Body mass index (BMI) 36.0-36.9, adult: Secondary | ICD-10-CM | POA: Diagnosis not present

## 2021-08-17 DIAGNOSIS — I700 Atherosclerosis of aorta: Secondary | ICD-10-CM | POA: Diagnosis not present

## 2021-08-17 DIAGNOSIS — N289 Disorder of kidney and ureter, unspecified: Secondary | ICD-10-CM | POA: Diagnosis not present

## 2021-08-17 DIAGNOSIS — K8020 Calculus of gallbladder without cholecystitis without obstruction: Secondary | ICD-10-CM | POA: Diagnosis not present

## 2021-08-17 DIAGNOSIS — R6889 Other general symptoms and signs: Secondary | ICD-10-CM | POA: Diagnosis not present

## 2021-08-17 DIAGNOSIS — F17200 Nicotine dependence, unspecified, uncomplicated: Secondary | ICD-10-CM | POA: Diagnosis not present

## 2021-08-17 DIAGNOSIS — R3129 Other microscopic hematuria: Secondary | ICD-10-CM | POA: Diagnosis not present

## 2021-08-22 DIAGNOSIS — E559 Vitamin D deficiency, unspecified: Secondary | ICD-10-CM | POA: Diagnosis not present

## 2021-08-22 DIAGNOSIS — Z6835 Body mass index (BMI) 35.0-35.9, adult: Secondary | ICD-10-CM | POA: Diagnosis not present

## 2021-08-28 ENCOUNTER — Other Ambulatory Visit: Payer: Self-pay | Admitting: Family Medicine

## 2021-08-28 NOTE — Telephone Encounter (Signed)
Medication Refill - Medication: albuterol (PROVENTIL HFA;VENTOLIN HFA) 108 (90 BASE) MCG/ACT inhaler FLOVENT HFA 220 MCG/ACT inhaler celecoxib (CELEBREX) 200 MG capsule Something for her allergies Has the patient contacted their pharmacy?  Yes The pharmacy had nothing for her on file.  Preferred Pharmacy (with phone number or street name): [id number] [place] been seen for an appointment in the last year OR does the patient have an upcoming appointment? Yes.    Agent: Please be advised that RX refills may take up to 3 business days. We ask that you follow-up with your pharmacy.

## 2021-08-29 MED ORDER — CELECOXIB 200 MG PO CAPS: ORAL_CAPSULE | ORAL | 3 refills | Status: DC

## 2021-08-29 NOTE — Telephone Encounter (Signed)
Requested Prescriptions  Pending Prescriptions Disp Refills  . albuterol (VENTOLIN HFA) 108 (90 Base) MCG/ACT inhaler      Sig: Inhale 2 puffs into the lungs every 6 (six) hours as needed for wheezing.     Pulmonology:  Beta Agonists 2 Passed - 08/28/2021  2:21 PM      Passed - Last BP in normal range    BP Readings from Last 1 Encounters:  08/03/21 115/79         Passed - Last Heart Rate in normal range    Pulse Readings from Last 1 Encounters:  08/03/21 82         Passed - Valid encounter within last 12 months    Recent Outpatient Visits          3 weeks ago Class 2 severe obesity due to excess calories with serious comorbidity and body mass index (BMI) of 36.0 to 36.9 in adult [facility])   Primary Care at [facility], MD   2 months ago Class 2 severe obesity due to excess calories with serious comorbidity and body mass index (BMI) of 38.0 to 38.9 in adult [facility])   Primary Care at [facility], MD   2 months ago Annual physical exam   Primary Care at [facility], MD   5 months ago OAB (overactive bladder)   [facility] Care at [facility], MD      Future Appointments            In 2 weeks Dorna Mai, MD Primary Care at [facility]           . celecoxib (CELEBREX) 200 MG capsule 90 capsule 1    Sig: celecoxib 200 mg capsule  TAKE 1 CAPSULE BY MOUTH EVERY DAY FOR OSTEOARTHRITIS     Analgesics:  COX2 Inhibitors Failed - 08/28/2021  2:21 PM      Failed - Manual Review: Labs are only required if the patient has taken medication for more than 8 weeks.      Passed - HGB in normal range and within 360 days    Hemoglobin  Date Value Ref Range Status  06/14/2021 13.6 11.1 - 15.9 g/dL Final         Passed - Cr in normal range and within 360 days    Creatinine, Ser  Date Value Ref Range Status  06/14/2021 0.73 0.57 - 1.00 mg/dL Final         Passed - HCT in normal range and within 360 days    Hematocrit   Date Value Ref Range Status  06/14/2021 41.9 34.0 - 46.6 % Final         Passed - AST in normal range and within 360 days    AST  Date Value Ref Range Status  06/14/2021 19 0 - 40 IU/L Final         Passed - ALT in normal range and within 360 days    ALT  Date Value Ref Range Status  06/14/2021 16 0 - 32 IU/L Final         Passed - eGFR is 30 or above and within 360 days    GFR calc Af Amer  Date Value Ref Range Status  09/25/2019 >60 >60 mL/min Final   GFR calc non Af Amer  Date Value Ref Range Status  09/25/2019 >60 >60 mL/min Final   eGFR  Date Value Ref Range Status  06/14/2021 91 >59 mL/min/1.73 Final  Passed - Patient is not pregnant      Passed - Valid encounter within last 12 months    Recent Outpatient Visits          3 weeks ago Class 2 severe obesity due to excess calories with serious comorbidity and body mass index (BMI) of 36.0 to 36.9 in adult [facility])   Primary Care at [facility], MD   2 months ago Class 2 severe obesity due to excess calories with serious comorbidity and body mass index (BMI) of 38.0 to 38.9 in adult [facility])   Primary Care at [facility], MD   2 months ago Annual physical exam   Primary Care at [facility], MD   5 months ago OAB (overactive bladder)   Primary Care at [facility], MD      Future Appointments            In 2 weeks Dorna Mai, MD Primary Care at [facility]           . FLOVENT HFA 220 MCG/ACT inhaler 1 each     Sig: Inhale 2 puffs into the lungs in the morning and at bedtime.     Not Delegated - Pulmonology:  Corticosteroids 2 Failed - 08/28/2021  2:21 PM      Failed - This refill cannot be delegated      Passed - Valid encounter within last 12 months    Recent Outpatient Visits          3 weeks ago Class 2 severe obesity due to excess calories with serious comorbidity and body mass index (BMI) of 36.0 to 36.9 in adult [facility])    Primary Care at [facility], MD   2 months ago Class 2 severe obesity due to excess calories with serious comorbidity and body mass index (BMI) of 38.0 to 38.9 in adult [facility])   Primary Care at [facility], MD   2 months ago Annual physical exam   Primary Care at [facility], MD   5 months ago OAB (overactive bladder)   Primary Care at [facility], MD      Future Appointments            In 2 weeks Dorna Mai, MD Primary Care at [facility]

## 2021-08-29 NOTE — Telephone Encounter (Signed)
Requested medications are due for refill today.  unsure  Requested medications are on the active medications list.  yes  Last refill. Albuterol 07/15/2012 - historical medication. Flovent 04/05/2019  Future visit scheduled.   yes  Notes to clinic.  Albuterol is listed as historical medication. Flovent is historical and not delegated.    Requested Prescriptions  Pending Prescriptions Disp Refills   albuterol (VENTOLIN HFA) 108 (90 Base) MCG/ACT inhaler      Sig: Inhale 2 puffs into the lungs every 6 (six) hours as needed for wheezing.     Pulmonology:  Beta Agonists 2 Passed - 08/28/2021  2:21 PM      Passed - Last BP in normal range    BP Readings from Last 1 Encounters:  08/03/21 115/79         Passed - Last Heart Rate in normal range    Pulse Readings from Last 1 Encounters:  08/03/21 82         Passed - Valid encounter within last 12 months    Recent Outpatient Visits           3 weeks ago Class 2 severe obesity due to excess calories with serious comorbidity and body mass index (BMI) of 36.0 to 36.9 in adult [facility])   Primary Care at [facility], MD   2 months ago Class 2 severe obesity due to excess calories with serious comorbidity and body mass index (BMI) of 38.0 to 38.9 in adult [facility])   Primary Care at [facility], MD   2 months ago Annual physical exam   Primary Care at [facility], MD   5 months ago OAB (overactive bladder)   Primary Care at [facility], MD       Future Appointments             In 2 weeks Dorna Mai, MD Primary Care at [facility] 220 MCG/ACT inhaler 1 each     Sig: Inhale 2 puffs into the lungs in the morning and at bedtime.     Not Delegated - Pulmonology:  Corticosteroids 2 Failed - 08/28/2021  2:21 PM      Failed - This refill cannot be delegated      Passed - Valid encounter within last 12 months    Recent Outpatient Visits            3 weeks ago Class 2 severe obesity due to excess calories with serious comorbidity and body mass index (BMI) of 36.0 to 36.9 in adult [facility])   Primary Care at [facility], MD   2 months ago Class 2 severe obesity due to excess calories with serious comorbidity and body mass index (BMI) of 38.0 to 38.9 in adult [facility])   Primary Care at [facility], MD   2 months ago Annual physical exam   Primary Care at [facility], MD   5 months ago OAB (overactive bladder)   Primary Care at [facility], MD       Future Appointments             In 2 weeks Dorna Mai, MD Primary Care at [facility]             Signed Prescriptions Disp Refills   celecoxib (CELEBREX) 200 MG capsule 90 capsule 3  Sig: celecoxib 200 mg capsule  TAKE 1 CAPSULE BY MOUTH EVERY DAY FOR OSTEOARTHRITIS     Analgesics:  COX2 Inhibitors Failed - 08/28/2021  2:21 PM      Failed - Manual Review: Labs are only required if the patient has taken medication for more than 8 weeks.      Passed - HGB in normal range and within 360 days    Hemoglobin  Date Value Ref Range Status  06/14/2021 13.6 11.1 - 15.9 g/dL Final         Passed - Cr in normal range and within 360 days    Creatinine, Ser  Date Value Ref Range Status  06/14/2021 0.73 0.57 - 1.00 mg/dL Final         Passed - HCT in normal range and within 360 days    Hematocrit  Date Value Ref Range Status  06/14/2021 41.9 34.0 - 46.6 % Final         Passed - AST in normal range and within 360 days    AST  Date Value Ref Range Status  06/14/2021 19 0 - 40 IU/L Final         Passed - ALT in normal range and within 360 days    ALT  Date Value Ref Range Status  06/14/2021 16 0 - 32 IU/L Final         Passed - eGFR is 30 or above and within 360 days    GFR calc Af Amer  Date Value Ref Range Status  09/25/2019 >60 >60 mL/min Final   GFR calc non Af Amer  Date Value Ref  Range Status  09/25/2019 >60 >60 mL/min Final   eGFR  Date Value Ref Range Status  06/14/2021 91 >59 mL/min/1.73 Final         Passed - Patient is not pregnant      Passed - Valid encounter within last 12 months    Recent Outpatient Visits           3 weeks ago Class 2 severe obesity due to excess calories with serious comorbidity and body mass index (BMI) of 36.0 to 36.9 in adult [facility])   Primary Care at [facility], MD   2 months ago Class 2 severe obesity due to excess calories with serious comorbidity and body mass index (BMI) of 38.0 to 38.9 in adult [facility])   Primary Care at [facility], MD   2 months ago Annual physical exam   Primary Care at [facility], MD   5 months ago OAB (overactive bladder)   Primary Care at [facility], MD       Future Appointments             In 2 weeks Dorna Mai, MD Primary Care at [facility]

## 2021-08-30 MED ORDER — CELECOXIB 200 MG PO CAPS: ORAL_CAPSULE | ORAL | 3 refills | Status: AC

## 2021-08-30 NOTE — Telephone Encounter (Signed)
Pt called for status update, says she needs all of these medications before she travels next week. The celebrex was printed not e-scribed. Pharmacy does not have it.

## 2021-09-01 ENCOUNTER — Other Ambulatory Visit: Payer: Self-pay | Admitting: *Deleted

## 2021-09-01 MED ORDER — ALBUTEROL SULFATE HFA 108 (90 BASE) MCG/ACT IN AERS: 2.0000 | INHALATION_SPRAY | Freq: Four times a day (QID) | RESPIRATORY_TRACT | 3 refills | Status: AC | PRN

## 2021-09-03 ENCOUNTER — Other Ambulatory Visit: Payer: Self-pay | Admitting: Family Medicine

## 2021-09-12 ENCOUNTER — Ambulatory Visit: Payer: Medicare HMO | Admitting: Family Medicine

## 2021-09-15 DIAGNOSIS — R6889 Other general symptoms and signs: Secondary | ICD-10-CM | POA: Diagnosis not present

## 2021-09-20 ENCOUNTER — Encounter: Payer: Medicare HMO | Admitting: Gastroenterology

## 2021-09-21 ENCOUNTER — Other Ambulatory Visit: Payer: Self-pay | Admitting: Family Medicine

## 2021-09-21 NOTE — Telephone Encounter (Signed)
Tizanidine prescribed 09/05/2021 for 60 day supply. Call patient to confirm necessity of early refill request.

## 2021-09-22 NOTE — Telephone Encounter (Signed)
Requested Prescriptions   Refused Prescriptions Disp Refills   tiZANidine (ZANAFLEX) 2 MG tablet [Pharmacy Med Name: TIZANIDINE HCL 2 MG TABLET] 30 tablet 1    Sig: TAKE 2 TABLETS (4 MG TOTAL) BY MOUTH DAILY AS NEEDED FOR MUSCLE SPASMS.    Refused By: Camillia Herter    Reason for Refusal: Patient should contact provider first     I have attempted without success to contact this patient by phone to discuss medication refill.

## 2021-09-26 DIAGNOSIS — R319 Hematuria, unspecified: Secondary | ICD-10-CM | POA: Diagnosis not present

## 2021-09-26 DIAGNOSIS — N958 Other specified menopausal and perimenopausal disorders: Secondary | ICD-10-CM | POA: Diagnosis not present

## 2021-09-26 DIAGNOSIS — N3281 Overactive bladder: Secondary | ICD-10-CM | POA: Diagnosis not present

## 2021-09-26 DIAGNOSIS — R6889 Other general symptoms and signs: Secondary | ICD-10-CM | POA: Diagnosis not present

## 2021-09-26 DIAGNOSIS — N3946 Mixed incontinence: Secondary | ICD-10-CM | POA: Diagnosis not present

## 2021-09-30 ENCOUNTER — Ambulatory Visit (HOSPITAL_COMMUNITY)
Admission: EM | Admit: 2021-09-30 | Discharge: 2021-09-30 | Disposition: A | Discharge status: Home / Self Care | Payer: Medicare HMO | Attending: Physician Assistant | Admitting: Physician Assistant

## 2021-09-30 ENCOUNTER — Encounter (HOSPITAL_COMMUNITY): Payer: Self-pay

## 2021-09-30 DIAGNOSIS — N766 Ulceration of vulva: Secondary | ICD-10-CM | POA: Insufficient documentation

## 2021-09-30 DIAGNOSIS — N760 Acute vaginitis: Secondary | ICD-10-CM | POA: Insufficient documentation

## 2021-09-30 LAB — POCT URINALYSIS DIPSTICK, ED / UC
Bilirubin Urine: NEGATIVE
Glucose, UA: NEGATIVE mg/dL
Ketones, ur: NEGATIVE mg/dL
Leukocytes,Ua: NEGATIVE
Nitrite: NEGATIVE
Protein, ur: NEGATIVE mg/dL
Specific Gravity, Urine: >=1.03 (ref 1.005–1.030)
Urobilinogen, UA: 1 mg/dL (ref 0.0–1.0)
pH: 5.5 (ref 5.0–8.0)

## 2021-09-30 MED ORDER — ACETAMINOPHEN 325 MG PO TABS: 975.0000 mg | ORAL_TABLET | Freq: Once | ORAL | Status: DC

## 2021-09-30 NOTE — ED Triage Notes (Signed)
Pt has a bladder stimulator and thinks she may have irritated her vaginal area. She c/o vaginal discharge and dysuria x 5 days.

## 2021-09-30 NOTE — Discharge Instructions (Addendum)
We are testing you for STIs and we will contact you with these results in a few days if they are positive and we need to arrange treatment.  Please switch to hypoallergenic soaps/detergents including Hulan Fray as is possible this is contributing to your irritation.  Please also follow-up with your urogynecologist for further evaluation and management.  If you develop any worsening symptoms you need to be seen immediately.

## 2021-09-30 NOTE — ED Provider Notes (Signed)
[facility]    CSN: [id number] Arrival date & time: 09/30/21  1000      History   Chief Complaint No chief complaint on file.   HPI Jasmine Estrada is a 66 y.o. female.   Patient presents today with a 5-day history of vaginal irritation.  She describes this as an ongoing burning sensation within the vagina.  She denies any significant urinary symptoms increased from baseline; has overactive bladder so has chronic urgency and frequency.  She denies any significant dysuria.  She has not noticed any change in vaginal discharge but does use a lubricant and so often has discharge.  Denies any associated odor or change in character.  She has a bladder stimulator and initially thought that symptoms were related to higher setting on this device but she has since lowered this and continues to have significant symptoms.  She denies any specific concern for STI but is open to testing.  She denies history of diabetes or immunosuppression.  Does not take SGLT2 inhibitor.  She has not tried any over-the-counter medication for management.  Denies any recent antibiotics.    Past Medical History:  Diagnosis Date   Arthritis    legs   Asthma    prn inhaler   Cough 07/15/2012   Dyspnea    Essential and other specified forms of tremor 01/26/2013   Full dentures    Mass of knee 07/2012   left   Memory difficulty 01/26/2013   Obesity    Overactive bladder 2019   Sleep apnea    Stroke [facility])    right sided-weakness   Tremor    right foot   Urinary frequency     Patient Active Problem List   Diagnosis Date Noted   Asthma 08/03/2021   Fatty (change of) liver, not elsewhere classified 08/03/2021   Hyperlipidemia 08/03/2021   Hypertension 08/03/2021   Incontinence 08/03/2021   Menopause 08/03/2021   Osteoarthritis 08/03/2021   Vitamin D deficiency 08/03/2021   Morbid obesity ([place]) 08/03/2021   Vaginal atrophy 05/02/2021   Obese 09/25/2019   Right knee OA 09/25/2019   S/P right  TKA 09/24/2019   Pain in left knee 07/29/2019   Pain in right knee 07/29/2019   Ankle edema 04/12/2015   Status post right foot surgery 03/02/2015   Equinus deformity of foot, acquired 02/02/2015   Tenosynovitis of ankle 01/03/2015   Posterior tibial tendon dysfunction 01/03/2015   Onychomycosis 01/03/2015   Urge urinary incontinence 11/11/2014   Mixed stress and urge urinary incontinence 11/11/2014   Hematuria, microscopic 09/23/2014   OAB (overactive bladder) 09/23/2014   Lumbago 08/12/2013   Pain in limb 05/25/2013   Essential and other specified forms of tremor 01/26/2013   Memory difficulty 01/26/2013    Past Surgical History:  Procedure Laterality Date   ACHILLES TENDON LENGTHENING Right 02/25/2015   BOTOX INJECTION N/A 11/19/2017   Procedure: CYSTOSCOPY BOTOX INJECTION;  Surgeon: Bjorn Loser, MD;  Location: [place];  Service: Urology;  Laterality: N/A;   CATARACT EXTRACTION Bilateral 2020   IRIDOTOMY / IRIDECTOMY Bilateral    JOINT REPLACEMENT     MASS EXCISION Left 07/21/2012   Procedure: EXCISION OF LARGE MASS LEFT KNEE WITH LIPOSUCTION ASSISTED;  Surgeon: Cristine Polio, MD;  Location: [place];  Service: Plastics;  Laterality: Left;   TOTAL KNEE ARTHROPLASTY Right 09/24/2019   Procedure: TOTAL KNEE ARTHROPLASTY;  Surgeon: Paralee Cancel, MD;  Location: [facility];  Service: Orthopedics;  Laterality: Right;  19mns   TUBAL LIGATION  1982    OB History   No obstetric history on file.      Home Medications    Prior to Admission medications   Medication Sig Start Date End Date Taking? Authorizing Provider  Acetaminophen-Codeine 300-30 MG tablet Take by mouth.    [provider]  albuterol (VENTOLIN HFA) 108 (90 Base) MCG/ACT inhaler Inhale 2 puffs into the lungs every 6 (six) hours as needed for wheezing. 09/01/21   WDorna Mai MD  aspirin 81 MG chewable tablet aspirin 81 mg chewable tablet  CHEW 1 TABLET TWICE A DAY  BY ORAL ROUTE FOR 30 DAYS.    [provider]  atorvastatin (LIPITOR) 40 MG tablet Take 1 tablet (40 mg total) by mouth daily. 03/20/21   WDorna Mai MD  busPIRone (BUSPAR) 10 MG tablet Take by mouth. 04/21/21   [provider]  celecoxib (CELEBREX) 200 MG capsule 1 capsule with food    [provider]  celecoxib (CELEBREX) 200 MG capsule celecoxib 200 mg capsule  TAKE 1 CAPSULE BY MOUTH EVERY DAY FOR OSTEOARTHRITIS 08/30/21   WDorna Mai MD  citalopram (CELEXA) 10 MG tablet Take by mouth. 12/09/20   [provider]  diazepam (VALIUM) 10 MG tablet Take 1 tablet (10 mg total) by mouth every 12 (twelve) hours as needed for anxiety (Take 1 po 30 minutes before flight). 08/03/21   WDorna Mai MD  diclofenac (VOLTAREN) 75 MG EC tablet Take by mouth.    [provider]  DULoxetine (CYMBALTA) 20 MG capsule 1 capsule 12/16/20   [provider]  DULoxetine (CYMBALTA) 30 MG capsule 1 po qam x7 days, then increase to 2 po qam 12/22/20   [provider]  Ergocalciferol (VITAMIN D2) 10 MCG (400 UNIT) TABS     [provider]  FLOVENT HFA 220 MCG/ACT inhaler Inhale 2 puffs into the lungs in the morning and at bedtime.  04/05/19   [provider]  furosemide (LASIX) 20 MG tablet Take by mouth. 04/06/21   [provider]  gabapentin (NEURONTIN) 300 MG capsule Take by mouth.    [provider]  hydrOXYzine (ATARAX) 25 MG tablet Take by mouth.    [provider]  ibuprofen (ADVIL) 800 MG tablet Take by mouth.    [provider]  methocarbamol (ROBAXIN) 500 MG tablet methocarbamol 500 mg tablet  TAKE 1 TABLET BY MOUTH EVERY 6 HOURS AS NEEDED SPASMS    [provider]  NEUPRO 1 MG/24HR PT24 Apply 1 patch topically daily.  08/28/19   [provider]  nitrofurantoin, macrocrystal-monohydrate, (MACROBID) 100 MG capsule Take by mouth.    [provider]  ondansetron (ZOFRAN) 4  MG tablet Take by mouth. 02/14/20   [provider]  oxybutynin (DITROPAN) 5 MG tablet Take 10 mg by mouth daily at 6 (six) AM.    [provider]  oxybutynin (DITROPAN-XL) 10 MG 24 hr tablet Take by mouth. 05/10/21   [provider]  oxybutynin (DITROPAN-XL) 5 MG 24 hr tablet Take 5 mg by mouth daily. 03/25/21   [provider]  phentermine (ADIPEX-P) 37.5 MG tablet Take 1 tablet (37.5 mg total) by mouth daily before breakfast. 08/03/21   WDorna Mai MD  phentermine 37.5 MG capsule Take by mouth.    [provider]  pregabalin (LYRICA) 75 MG capsule Take by mouth.    [provider]  PREMARIN vaginal cream Place vaginally. 03/28/21   [provider]  primidone (MYSOLINE) 50 MG tablet Take 25 mg by mouth at bedtime.  04/15/19   [provider]  tiZANidine (ZANAFLEX) 2 MG tablet TAKE 2 TABLETS (4 MG TOTAL) BY MOUTH DAILY AS NEEDED FOR MUSCLE SPASMS. 09/05/21   Dorna Mai, MD  tiZANidine (ZANAFLEX) 4 MG tablet 1 tablet as needed    [provider]  Vibegron (GEMTESA) 75 MG TABS Take 1 tablet by mouth daily. 10/26/20   [provider]  Vitamin D, Ergocalciferol, (DRISDOL) 1.25 MG (50000 UNIT) CAPS capsule Take 1 capsule (50,000 Units total) by mouth every 7 (seven) days. 06/15/21   Dorna Mai, MD    Family History Family History  Problem Relation Age of Onset   Breast cancer Sister    Cancer Sister    Diabetes Sister    Mental illness Sister    Alzheimer's disease Maternal Aunt    Alzheimer's disease Maternal Grandmother    Breast cancer Cousin    Mental illness Brother     Social History Social History   Tobacco Use   Smoking status: Every Day    Packs/day: 0.25    Years: 52.00    Total pack years: 13.00    Types: Cigarettes   Smokeless tobacco: Never   Tobacco comments:    1 pack every 4 days  Vaping Use   Vaping Use: Never used  Substance Use Topics   Alcohol use: Yes     Comment: rare   Drug use: No     Allergies   Patient has no known allergies.   Review of Systems Review of Systems  Constitutional:  Positive for activity change. Negative for appetite change, fatigue and fever.  Respiratory:  Negative for cough and shortness of breath.   Cardiovascular:  Negative for chest pain.  Gastrointestinal:  Negative for abdominal pain, diarrhea, nausea and vomiting.  Genitourinary:  Positive for urgency (Chronic) and vaginal pain. Negative for dysuria, frequency, pelvic pain, vaginal bleeding and vaginal discharge.  Neurological:  Negative for dizziness, light-headedness and headaches.     Physical Exam Triage Vital Signs ED Triage Vitals  Enc Vitals Group     BP 09/30/21 1013 (!) 152/87     Pulse Rate 09/30/21 1013 75     Resp 09/30/21 1013 18     Temp 09/30/21 1013 98.4 F (36.9 C)     Temp Source 09/30/21 1013 Oral     SpO2 09/30/21 1013 100 %     Weight --      Height --      Head Circumference --      Peak Flow --      Pain Score 09/30/21 1014 8     Pain Loc --      Pain Edu? --      Excl. in [place]? --    No data found.  Updated Vital Signs BP (!) 152/87 (BP Location: Left Arm)   Pulse 75   Temp 98.4 F (36.9 C) (Oral)   Resp 18   SpO2 100%   Visual Acuity Right Eye Distance:   Left Eye Distance:   Bilateral Distance:    Right Eye Near:   Left Eye Near:    Bilateral Near:     Physical Exam Vitals reviewed.  Constitutional:      General: She is awake. She is not in acute distress.    Appearance: Normal appearance. She is well-developed. She is not ill-appearing.     Comments: Very pleasant female appears stated age in  no acute distress sitting comfortably in exam room  HENT:     Head: Normocephalic and atraumatic.  Cardiovascular:     Rate and Rhythm: Normal rate and regular rhythm.     Heart sounds: Normal heart sounds, S1 normal and S2 normal. No murmur heard. Pulmonary:     Effort: Pulmonary effort is normal.      Breath sounds: Normal breath sounds. No wheezing, rhonchi or rales.     Comments: Clear to auscultation bilaterally Abdominal:     General: Bowel sounds are normal.     Palpations: Abdomen is soft.     Tenderness: There is no abdominal tenderness. There is no right CVA tenderness, left CVA tenderness, guarding or rebound.  Genitourinary:    Labia:        Right: Lesion present.        Left: No lesion.      Vagina: No vaginal discharge or tenderness.     Comments: Ulcerated lesion noted right inferior labia minora.  No significant discharge or erythema noted vaginal walls.  Pessary noted posterior vaginal vault. Psychiatric:        Behavior: Behavior is cooperative.      UC Treatments / Results  Labs (all labs ordered are listed, but only abnormal results are displayed) Labs Reviewed  POCT URINALYSIS DIPSTICK, ED / UC - Abnormal; Notable for the following components:      Result Value   Hgb urine dipstick MODERATE (*)    All other components within normal limits  URINE CULTURE  HSV CULTURE AND TYPING  CERVICOVAGINAL ANCILLARY ONLY    EKG   Radiology No results found.  Procedures Procedures (including critical care time)  Medications Ordered in UC Medications - No data to display  Initial Impression / Assessment and Plan / UC Course  I have reviewed the triage vital signs and the nursing notes.  Pertinent labs & imaging results that were available during my care of the patient were reviewed by me and considered in my medical decision making (see chart for details).     Patient is well-appearing, afebrile, nontoxic, nontachycardic.  Unclear etiology of symptoms.  UA showed hemoglobin without evidence of infection.  Given dysuria and discomfort we will send this for culture but defer antibiotics until results are available.  No obvious etiology on pelvic exam.  STI swab collected-results pending.  There was a single ulcerated lesion we discussed this could be related to  herpes which would cause a burning sensation, however, patient reports that symptoms are more internal so unsure if this is contributing to symptoms.  Recommended she use hypoallergenic soaps and detergents.  She is followed closely by urogynecology and was encouraged to follow-up with them soon as possible.  Discussed that if she develops any worsening symptoms she is to return immediately for further evaluation and management to which she expressed understanding.  All questions answered to patient satisfaction.  Final Clinical Impressions(s) / UC Diagnoses   Final diagnoses:  Acute vaginitis  Ulcer of genital labia     Discharge Instructions      We are testing you for STIs and we will contact you with these results in a few days if they are positive and we need to arrange treatment.  Please switch to hypoallergenic soaps/detergents including Hulan Fray as is possible this is contributing to your irritation.  Please also follow-up with your urogynecologist for further evaluation and management.  If you develop any worsening symptoms you need to be seen immediately.  ED Prescriptions   None    PDMP not reviewed this encounter.   Terrilee Croak, PA-C 09/30/21 1102

## 2021-10-01 LAB — URINE CULTURE: Culture: <10000 — AB

## 2021-10-02 LAB — CERVICOVAGINAL ANCILLARY ONLY
Bacterial Vaginitis (gardnerella): POSITIVE — AB
Candida Glabrata: NEGATIVE
Candida Vaginitis: NEGATIVE
Chlamydia: NEGATIVE
Comment: NEGATIVE
Comment: NEGATIVE
Comment: NEGATIVE
Comment: NEGATIVE
Comment: NEGATIVE
Comment: NORMAL
Neisseria Gonorrhea: NEGATIVE
Trichomonas: NEGATIVE

## 2021-10-03 ENCOUNTER — Telehealth (HOSPITAL_COMMUNITY): Payer: Self-pay | Admitting: Emergency Medicine

## 2021-10-03 LAB — HSV CULTURE AND TYPING

## 2021-10-03 MED ORDER — METRONIDAZOLE 500 MG PO TABS: 500.0000 mg | ORAL_TABLET | Freq: Two times a day (BID) | ORAL | 0 refills | Status: DC

## 2021-10-10 ENCOUNTER — Ambulatory Visit (INDEPENDENT_AMBULATORY_CARE_PROVIDER_SITE_OTHER): Payer: Medicare HMO | Admitting: Family Medicine

## 2021-10-10 ENCOUNTER — Encounter: Payer: Self-pay | Admitting: Family Medicine

## 2021-10-10 VITALS — Temp 98.0°F | Wt 219.8 lb

## 2021-10-10 DIAGNOSIS — Z6835 Body mass index (BMI) 35.0-35.9, adult: Secondary | ICD-10-CM | POA: Diagnosis not present

## 2021-10-10 DIAGNOSIS — R6889 Other general symptoms and signs: Secondary | ICD-10-CM | POA: Diagnosis not present

## 2021-10-10 DIAGNOSIS — M25551 Pain in right hip: Secondary | ICD-10-CM

## 2021-10-10 MED ORDER — TRIAMCINOLONE ACETONIDE 40 MG/ML IJ SUSP
40.0000 mg | Freq: Once | INTRAMUSCULAR | Status: AC
  Administered 2021-10-10: 40 mg via INTRAMUSCULAR

## 2021-10-10 MED ORDER — PHENTERMINE HCL 37.5 MG PO TABS: 37.5000 mg | ORAL_TABLET | Freq: Every day | ORAL | 0 refills | Status: DC

## 2021-10-10 NOTE — Progress Notes (Signed)
Patient given triamcinolone Acetonide 40 mg   Promethazine 25 mg

## 2021-10-11 ENCOUNTER — Encounter: Payer: Self-pay | Admitting: Family Medicine

## 2021-10-11 NOTE — Progress Notes (Signed)
Established Patient Office Visit  Subjective    Patient ID: Jasmine Estrada, female    DOB: [birth year]  Age: 65 y.o. MRN: [id number]  CC:  Chief Complaint  Patient presents with   Follow-up    HPI Jasmine Estrada presents with complaint of right hip pain. She denies known trauma or injury.She also is for weight management.    Outpatient Encounter Medications as of 10/10/2021  Medication Sig   phentermine (ADIPEX-P) 37.5 MG tablet Take 1 tablet (37.5 mg total) by mouth daily before breakfast.   [DISCONTINUED] phentermine (ADIPEX-P) 37.5 MG tablet Take 1 tablet by mouth daily before breakfast.   Acetaminophen-Codeine 300-30 MG tablet Take by mouth.   albuterol (VENTOLIN HFA) 108 (90 Base) MCG/ACT inhaler Inhale 2 puffs into the lungs every 6 (six) hours as needed for wheezing.   aspirin 81 MG chewable tablet aspirin 81 mg chewable tablet  CHEW 1 TABLET TWICE A DAY BY ORAL ROUTE FOR 30 DAYS.   atorvastatin (LIPITOR) 40 MG tablet Take 1 tablet (40 mg total) by mouth daily.   beclomethasone (QVAR) 80 MCG/ACT inhaler Inhale into the lungs.   busPIRone (BUSPAR) 10 MG tablet Take by mouth.   celecoxib (CELEBREX) 200 MG capsule 1 capsule with food   celecoxib (CELEBREX) 200 MG capsule celecoxib 200 mg capsule  TAKE 1 CAPSULE BY MOUTH EVERY DAY FOR OSTEOARTHRITIS   citalopram (CELEXA) 10 MG tablet Take by mouth.   diazepam (VALIUM) 10 MG tablet Take 1 tablet (10 mg total) by mouth every 12 (twelve) hours as needed for anxiety (Take 1 po 30 minutes before flight).   DULoxetine (CYMBALTA) 20 MG capsule 1 capsule   DULoxetine (CYMBALTA) 30 MG capsule 1 po qam x7 days, then increase to 2 po qam   Ergocalciferol (VITAMIN D2) 10 MCG (400 UNIT) TABS    FLOVENT HFA 220 MCG/ACT inhaler Inhale 2 puffs into the lungs in the morning and at bedtime.    furosemide (LASIX) 20 MG tablet Take by mouth.   gabapentin (NEURONTIN) 300 MG capsule Take by mouth.   hydrOXYzine (ATARAX) 25 MG tablet Take by mouth.    ibuprofen (ADVIL) 800 MG tablet Take by mouth.   methocarbamol (ROBAXIN) 500 MG tablet methocarbamol 500 mg tablet  TAKE 1 TABLET BY MOUTH EVERY 6 HOURS AS NEEDED SPASMS   metroNIDAZOLE (FLAGYL) 500 MG tablet Take 1 tablet (500 mg total) by mouth 2 (two) times daily.   NEUPRO 1 MG/24HR PT24 Apply 1 patch topically daily.    nitrofurantoin, macrocrystal-monohydrate, (MACROBID) 100 MG capsule Take by mouth.   ondansetron (ZOFRAN) 4 MG tablet Take by mouth.   oxybutynin (DITROPAN) 5 MG tablet Take 10 mg by mouth daily at 6 (six) AM.   oxybutynin (DITROPAN-XL) 10 MG 24 hr tablet Take by mouth.   oxybutynin (DITROPAN-XL) 5 MG 24 hr tablet Take 5 mg by mouth daily.   pregabalin (LYRICA) 75 MG capsule Take by mouth.   PREMARIN vaginal cream Place vaginally.   primidone (MYSOLINE) 50 MG tablet Take 25 mg by mouth at bedtime.    tiZANidine (ZANAFLEX) 4 MG tablet 1 tablet as needed   Vibegron (GEMTESA) 75 MG TABS Take 1 tablet by mouth daily.   Vitamin D, Ergocalciferol, (DRISDOL) 1.25 MG (50000 UNIT) CAPS capsule Take 1 capsule (50,000 Units total) by mouth every 7 (seven) days.   [DISCONTINUED] diclofenac (VOLTAREN) 75 MG EC tablet Take by mouth.   [DISCONTINUED] phentermine (ADIPEX-P) 37.5 MG tablet Take 1 tablet (37.5 mg  total) by mouth daily before breakfast.   [DISCONTINUED] phentermine 37.5 MG capsule Take by mouth.   [DISCONTINUED] tiZANidine (ZANAFLEX) 2 MG tablet TAKE 2 TABLETS (4 MG TOTAL) BY MOUTH DAILY AS NEEDED FOR MUSCLE SPASMS.   [EXPIRED] triamcinolone acetonide (KENALOG-40) injection 40 mg    No facility-administered encounter medications on file as of 10/10/2021.    Past Medical History:  Diagnosis Date   Arthritis    legs   Asthma    prn inhaler   Cough 07/15/2012   Dyspnea    Essential and other specified forms of tremor 01/26/2013   Full dentures    Mass of knee 07/2012   left   Memory difficulty 01/26/2013   Obesity    Overactive bladder 2019   Sleep apnea     Stroke [facility])    right sided-weakness   Tremor    right foot   Urinary frequency     Past Surgical History:  Procedure Laterality Date   ACHILLES TENDON LENGTHENING Right 02/25/2015   BOTOX INJECTION N/A 11/19/2017   Procedure: CYSTOSCOPY BOTOX INJECTION;  Surgeon: Bjorn Loser, MD;  Location: [facility];  Service: Urology;  Laterality: N/A;   CATARACT EXTRACTION Bilateral 2020   IRIDOTOMY / IRIDECTOMY Bilateral    JOINT REPLACEMENT     MASS EXCISION Left 07/21/2012   Procedure: EXCISION OF LARGE MASS LEFT KNEE WITH LIPOSUCTION ASSISTED;  Surgeon: Cristine Polio, MD;  Location: [place];  Service: Plastics;  Laterality: Left;   TOTAL KNEE ARTHROPLASTY Right 09/24/2019   Procedure: TOTAL KNEE ARTHROPLASTY;  Surgeon: Paralee Cancel, MD;  [facility];  Service: Orthopedics;  Laterality: Right;  57mns   TUBAL LIGATION  1982    Family History  Problem Relation Age of Onset   Breast cancer Sister    Cancer Sister    Diabetes Sister    Mental illness Sister    Alzheimer's disease Maternal Aunt    Alzheimer's disease Maternal Grandmother    Breast cancer Cousin    Mental illness Brother     Social History   Socioeconomic History   Marital status: Divorced    Spouse name: Not on file   Number of children: 4   Years of education: HS   Highest education level: Not on file  Occupational History   Not on file  Tobacco Use   Smoking status: Every Day    Packs/day: 0.25    Years: 52.00    Total pack years: 13.00    Types: Cigarettes   Smokeless tobacco: Never   Tobacco comments:    1 pack every 4 days  Vaping Use   Vaping Use: Never used  Substance and Sexual Activity   Alcohol use: Yes    Comment: rare   Drug use: No   Sexual activity: Not on file  Other Topics Concern   Not on file  Social History Narrative   Not on file   Social Determinants of Health   Financial Resource Strain: Low Risk  (07/14/2021)   Overall Financial  Resource Strain (CARDIA)    Difficulty of Paying Living Expenses: Not hard at all  Food Insecurity: No Food Insecurity (07/14/2021)   Hunger Vital Sign    Worried About Running Out of Food in the Last Year: Never true    Ran Out of Food in the Last Year: Never true  Transportation Needs: No Transportation Needs (07/14/2021)   PRAPARE - THydrologist(Medical): No  Lack of Transportation (Non-Medical): No  Physical Activity: Sufficiently Active (07/14/2021)   Exercise Vital Sign    Days of Exercise per Week: 5 days    Minutes of Exercise per Session: 30 min  Stress: No Stress Concern Present (07/14/2021)   [place]    Feeling of Stress : Not at all  Social Connections: Socially Isolated (07/14/2021)   Social Connection and Isolation Panel [NHANES]    Frequency of Communication with Friends and Family: More than three times a week    Frequency of Social Gatherings with Friends and Family: Never    Attends Religious Services: Never    Marine scientist or Organizations: No    Attends Archivist Meetings: Never    Marital Status: Divorced  Human resources officer Violence: Not At Risk (07/14/2021)   Humiliation, Afraid, Rape, and Kick questionnaire    Fear of Current or Ex-Partner: No    Emotionally Abused: No    Physically Abused: No    Sexually Abused: No    Review of Systems  All other systems reviewed and are negative.       Objective    Temp 98 F (36.7 C) (Oral)   Wt 219 lb 12.8 oz (99.7 kg)   BMI 35.48 kg/m   Physical Exam Vitals and nursing note reviewed.  Constitutional:      General: She is not in acute distress. Cardiovascular:     Rate and Rhythm: Normal rate and regular rhythm.  Pulmonary:     Effort: Pulmonary effort is normal.     Breath sounds: Normal breath sounds.  Abdominal:     Palpations: Abdomen is soft.     Tenderness: There is no abdominal  tenderness.  Musculoskeletal:     Right hip: Tenderness present. Decreased range of motion.  Neurological:     General: No focal deficit present.     Mental Status: She is alert and oriented to person, place, and time.         Assessment & Plan:   1. Right hip pain Kenalog IM injection given. Tylenol/nsaids/topical preps prn - triamcinolone acetonide (KENALOG-40) injection 40 mg  2. Class 2 severe obesity due to excess calories with serious comorbidity and body mass index (BMI) of 35.0 to 35.9 in adult [facility]) Patient continuing to lose weight. Phentermine prescribed.    No follow-ups on file.   [name], MD

## 2021-10-18 ENCOUNTER — Other Ambulatory Visit: Payer: Self-pay | Admitting: Family Medicine

## 2021-10-23 ENCOUNTER — Ambulatory Visit (AMBULATORY_SURGERY_CENTER): Payer: Self-pay | Admitting: *Deleted

## 2021-10-23 ENCOUNTER — Other Ambulatory Visit: Payer: Self-pay | Admitting: Family Medicine

## 2021-10-23 ENCOUNTER — Other Ambulatory Visit: Payer: Self-pay | Admitting: Gastroenterology

## 2021-10-23 VITALS — Ht 66.0 in | Wt 219.0 lb

## 2021-10-23 DIAGNOSIS — Z1211 Encounter for screening for malignant neoplasm of colon: Secondary | ICD-10-CM

## 2021-10-23 DIAGNOSIS — R911 Solitary pulmonary nodule: Secondary | ICD-10-CM

## 2021-10-23 MED ORDER — NA SULFATE-K SULFATE-MG SULF 17.5-3.13-1.6 GM/177ML PO SOLN: 1.0000 | Freq: Once | ORAL | 0 refills | Status: AC

## 2021-10-23 NOTE — Progress Notes (Signed)
No egg or soy allergy known to patient  No issues known to pt with past sedation with any surgeries or procedures Patient denies ever being told they had issues or difficulty with intubation  No FH of Malignant Hyperthermia Pt is on diet pills is on Phentermine- is aware has to hold 10 days prior to colon  Pt is not on  home 02  Pt is not on blood thinners  Pt denies issues with constipation  No A fib or A flutter Have any cardiac testing pending--pt denies  Pt instructed to use Singlecare.com or GoodRx for a price reduction on prep

## 2021-10-25 ENCOUNTER — Telehealth: Payer: Self-pay | Admitting: Gastroenterology

## 2021-10-25 DIAGNOSIS — Z1211 Encounter for screening for malignant neoplasm of colon: Secondary | ICD-10-CM

## 2021-10-25 MED ORDER — PEG 3350-KCL-NA BICARB-NACL 420 G PO SOLR: 4000.0000 mL | Freq: Once | ORAL | 0 refills | Status: AC

## 2021-10-25 NOTE — Telephone Encounter (Signed)
Inbound call from patient. Patient has upcoming procedure 11/17/21. Patient states she cannot afford prep medication and is requesting to speak with a nurse. Please give patient a call back to further advise.  Thank you

## 2021-10-25 NOTE — Telephone Encounter (Signed)
Pt states prep is 85$ and change - she does not want to pay for miralax- will send in Golytely and mail her new instructions - verified her address

## 2021-10-27 ENCOUNTER — Ambulatory Visit (INDEPENDENT_AMBULATORY_CARE_PROVIDER_SITE_OTHER): Payer: Medicare HMO | Admitting: Podiatry

## 2021-10-27 ENCOUNTER — Encounter: Payer: Self-pay | Admitting: Podiatry

## 2021-10-27 DIAGNOSIS — B351 Tinea unguium: Secondary | ICD-10-CM

## 2021-10-27 DIAGNOSIS — M79674 Pain in right toe(s): Secondary | ICD-10-CM | POA: Diagnosis not present

## 2021-10-27 DIAGNOSIS — Q828 Other specified congenital malformations of skin: Secondary | ICD-10-CM | POA: Diagnosis not present

## 2021-10-27 DIAGNOSIS — M79671 Pain in right foot: Secondary | ICD-10-CM

## 2021-10-27 DIAGNOSIS — M79675 Pain in left toe(s): Secondary | ICD-10-CM | POA: Diagnosis not present

## 2021-10-27 DIAGNOSIS — M79672 Pain in left foot: Secondary | ICD-10-CM

## 2021-11-01 NOTE — Progress Notes (Signed)
  Subjective:  Patient ID: Jasmine Estrada, female    DOB: [birth year],  MRN: [id number]  Jasmine Estrada presents to clinic today for painful porokeratotic lesion(s) left lower extremity and painful mycotic toenails that limit ambulation. Painful toenails interfere with ambulation. Aggravating factors include wearing enclosed shoe gear. Pain is relieved with periodic professional debridement. Painful porokeratotic lesions are aggravated when weightbearing with and without shoegear. Pain is relieved with periodic professional debridement.  New problem(s): None.   PCP is Fulp, Cammie, MD , and last visit was one week ago.  No Known Allergies  Review of Systems: Negative except as noted in the HPI.  Objective:  Jasmine Estrada is a pleasant 66 y.o. female obese in NAD. AAO x 3.   Vascular Examination: Vascular status intact b/l with palpable  DP pulses.  Faintly palpable PT pulses. Pedal hair present b/l. CFT immediate b/l. No edema. No pain with calf compression b/l. Skin temperature gradient WNL b/l.   Neurological Examination: Sensation grossly intact b/l with 10 gram monofilament. Vibratory sensation intact b/l.   Dermatological Examination: Pedal skin with normal turgor, texture and tone b/l. Toenails 1-5 b/l thick, discolored, elongated with subungual debris and pain on dorsal palpation.Porokeratotic lesion(s) plantar heel pad of left foot. No erythema, no edema, no drainage, no fluctuance.  Musculoskeletal Examination: Muscle strength 5/5 to b/l LE. 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 Pes planus deformity noted bilateral LE.  Last A1c:      Latest Ref Rng & Units 06/14/2021   11:04 AM  Hemoglobin A1C  Hemoglobin-A1c % 4.8 - 5.6 % CANCELED    5.3    Assessment/Plan: 1. Pain due to onychomycosis of toenails of both feet   2. Left foot pain   3. Porokeratosis      -Patient was evaluated and treated. All patient's and/or POA's questions/concerns answered on today's  visit. -Medicaid ABN signed for services of paring of corn(s)/callus(es)/porokeratos(es) today. Copy in patient chart. -Patient to continue soft, supportive shoe gear daily. -Toenails 1-5 b/l were debrided in length and girth with sterile nail nippers and dremel without iatrogenic bleeding.  -Porokeratotic lesion(s) plantar heel pad of left foot pared and enucleated with sterile scalpel blade without incident. Total number of lesions debrided=1. -Patient/POA to call should there be question/concern in the interim.    Return in about 3 months (around 01/27/2022).  Marzetta Board, DPM

## 2021-11-16 ENCOUNTER — Other Ambulatory Visit: Payer: Self-pay | Admitting: Infectious Diseases

## 2021-11-16 DIAGNOSIS — Z1231 Encounter for screening mammogram for malignant neoplasm of breast: Secondary | ICD-10-CM

## 2021-11-17 ENCOUNTER — Encounter: Payer: Self-pay | Admitting: Gastroenterology

## 2021-11-17 ENCOUNTER — Ambulatory Visit (AMBULATORY_SURGERY_CENTER): Payer: Medicare HMO | Admitting: Gastroenterology

## 2021-11-17 VITALS — BP 136/65 | HR 56 | Temp 97.3°F | Resp 14 | Ht 66.0 in | Wt 234.0 lb

## 2021-11-17 DIAGNOSIS — D122 Benign neoplasm of ascending colon: Secondary | ICD-10-CM | POA: Diagnosis not present

## 2021-11-17 DIAGNOSIS — Z1211 Encounter for screening for malignant neoplasm of colon: Secondary | ICD-10-CM

## 2021-11-17 DIAGNOSIS — D123 Benign neoplasm of transverse colon: Secondary | ICD-10-CM | POA: Diagnosis not present

## 2021-11-17 DIAGNOSIS — K635 Polyp of colon: Secondary | ICD-10-CM

## 2021-11-17 MED ORDER — SODIUM CHLORIDE 0.9 % IV SOLN: 500.0000 mL | Freq: Once | INTRAVENOUS | Status: DC

## 2021-11-17 NOTE — Op Note (Signed)
[facility] Patient Name: Lorenzo Pereyra Procedure Date: 11/17/2021 10:41 AM MRN: [id number] Endoscopist: Thornton Park MD, MD Age: 66 Referring MD:  Date of Birth: [birth year] Gender: Female Account #: [id number] Procedure:                Colonoscopy Indications:              Screening for colorectal malignant neoplasm, This                            is the patient's first colonoscopy                           No known family history of colon cancer or polyps Medicines:                Monitored Anesthesia Care Procedure:                Pre-Anesthesia Assessment:                           - Prior to the procedure, a History and Physical                            was performed, and patient medications and                            allergies were reviewed. The patient's tolerance of                            previous anesthesia was also reviewed. The risks                            and benefits of the procedure and the sedation                            options and risks were discussed with the patient.                            All questions were answered, and informed consent                            was obtained. Prior Anticoagulants: The patient has                            taken no previous anticoagulant or antiplatelet                            agents. ASA Grade Assessment: III - A patient with                            severe systemic disease. After reviewing the risks                            and benefits, the patient was deemed in  satisfactory condition to undergo the procedure.                           After obtaining informed consent, the colonoscope                            was passed under direct vision. Throughout the                            procedure, the patient's blood pressure, pulse, and                            oxygen saturations were monitored continuously. The                            CF [id number] #[id number]  was introduced through the anus                            and advanced to the 3 cm into the ileum. A second                            forward view of the right colon was performed. The                            colonoscopy was performed without difficulty. The                            patient tolerated the procedure well. The quality                            of the bowel preparation was good. The terminal                            ileum, ileocecal valve, appendiceal orifice, and                            rectum were photographed. Scope In: 10:59:23 AM Scope Out: 11:25:47 AM Scope Withdrawal Time: 0 hours 21 minutes 24 seconds  Total Procedure Duration: 0 hours 26 minutes 24 seconds  Findings:                 The perianal and digital rectal examinations were                            normal.                           Many small and large-mouthed diverticula were found                            in the sigmoid colon, descending colon and                            ascending colon.  A diffuse area of mildly melanotic mucosa was found                            in the entire colon.                           A 3 mm polyp was found in the transverse colon. The                            polyp was sessile. The polyp was removed with a                            cold snare. Resection and retrieval were complete.                            Estimated blood loss was minimal.                           Two flat polyps were found in the distal ascending                            colon. The polyps were 12 to 15 mm in size. These                            polyps were removed with a piecemeal technique                            using a cold snare. Resection and retrieval were                            complete. The more proximal polypectomy site was                            tattooed with an injection of 3 mL of Spot (carbon                            black).                            The exam was otherwise without abnormality on                            direct and retroflexion views. Complications:            No immediate complications. Estimated Blood Loss:     Estimated blood loss was minimal. Impression:               - Diverticulosis in the sigmoid colon, in the                            descending colon and in the ascending colon.                           - Melanotic mucosa in the  entire examined colon.                           - One 3 mm polyp in the transverse colon, removed                            with a cold snare. Resected and retrieved.                           - Two 12 to 15 mm polyps in the distal ascending                            colon, removed piecemeal using a cold snare.                            Resected and retrieved. Tattooed.                           - The examination was otherwise normal on direct                            and retroflexion views. Recommendation:           - Patient has a contact number available for                            emergencies. The signs and symptoms of potential                            delayed complications were discussed with the                            patient. Return to normal activities tomorrow.                            Written discharge instructions were provided to the                            patient.                           - High fiber diet.                           - Continue present medications.                           - Await pathology results.                           - Repeat colonoscopy date to be determined after                            pending pathology results are reviewed for  surveillance.                           - Emerging evidence supports eating a diet of                            fruits, vegetables, grains, calcium, and yogurt                            while reducing red meat and alcohol may reduce the                             risk of colon cancer.                           - Given these results, all first degree relatives                            (brothers, sisters, children, parents) should start                            colon cancer screening at age 70.                           - Thank you for allowing me to be involved in your                            colon cancer prevention. Thornton Park MD, MD 11/17/2021 11:35:50 AM This report has been signed electronically.

## 2021-11-17 NOTE — Progress Notes (Signed)
Called to room to assist during endoscopic procedure.  Patient ID and intended procedure confirmed with present staff. Received instructions for my participation in the procedure from the performing physician.  

## 2021-11-17 NOTE — Progress Notes (Signed)
Vital signs checked by:DT  The patient states no changes in medical or surgical history since pre-visit screening on 10/23/21.

## 2021-11-17 NOTE — Progress Notes (Signed)
Referring Provider: Dorna Mai, MD Primary Care Physician:  Antony Blackbird, MD  Indication for Procedure:  Colon cancer screening   IMPRESSION:  Need for colon cancer screening Appropriate candidate for monitored anesthesia care  PLAN: Colonoscopy in the [facility] today   HPI: Jasmine Estrada is a 66 y.o. female presents for screening colonoscopy.  No prior colonoscopy or colon cancer screening.  No baseline GI symptoms.   No known family history of colon cancer or polyps. No family history of uterine/endometrial cancer, pancreatic cancer or gastric/stomach cancer.   Past Medical History:  Diagnosis Date   Anxiety    Arthritis    legs   Asthma    prn inhaler   Cataract    removed both eyes   Cough 07/15/2012   Depression    Dyspnea    Essential and other specified forms of tremor 01/26/2013   Full dentures    Mass of knee 07/2012   left   Memory difficulty 01/26/2013   Obesity    Overactive bladder 2019   Sleep apnea    Stroke [facility])    right sided-weakness   Tremor    right foot and leg   Urinary frequency     Past Surgical History:  Procedure Laterality Date   ACHILLES TENDON LENGTHENING Right 02/25/2015   bladder stimulator     still present but did not help   BOTOX INJECTION N/A 11/19/2017   Procedure: CYSTOSCOPY BOTOX INJECTION;  Surgeon: Bjorn Loser, MD;  Location: [facility];  Service: Urology;  Laterality: N/A;   CATARACT EXTRACTION Bilateral 2020   IRIDOTOMY / IRIDECTOMY Bilateral    JOINT REPLACEMENT     MASS EXCISION Left 07/21/2012   Procedure: EXCISION OF LARGE MASS LEFT KNEE WITH LIPOSUCTION ASSISTED;  Surgeon: Cristine Polio, MD;  Location: [facility];  Service: Plastics;  Laterality: Left;   TOTAL KNEE ARTHROPLASTY Right 09/24/2019   Procedure: TOTAL KNEE ARTHROPLASTY;  Surgeon: Paralee Cancel, MD;  Location: [facility];  Service: Orthopedics;  Laterality: Right;  28mns   TUBAL LIGATION  1982     Current Outpatient Medications  Medication Sig Dispense Refill   albuterol (VENTOLIN HFA) 108 (90 Base) MCG/ACT inhaler Inhale 2 puffs into the lungs every 6 (six) hours as needed for wheezing. 1 each 3   atorvastatin (LIPITOR) 40 MG tablet Take 1 tablet (40 mg total) by mouth daily. 90 tablet 1   beclomethasone (QVAR) 80 MCG/ACT inhaler Inhale into the lungs.     celecoxib (CELEBREX) 200 MG capsule celecoxib 200 mg capsule  TAKE 1 CAPSULE BY MOUTH EVERY DAY FOR OSTEOARTHRITIS 90 capsule 3   EC-RX Testosterone 0.2 % CREA Place onto the skin.     Ergocalciferol (VITAMIN D2) 10 MCG (400 UNIT) TABS  (Patient not taking: Reported on 10/23/2021)     FLOVENT HFA 220 MCG/ACT inhaler Inhale 2 puffs into the lungs in the morning and at bedtime.      gabapentin (NEURONTIN) 100 MG capsule Take 200 mg by mouth at bedtime.     HYDROcodone-acetaminophen (NORCO/VICODIN) 5-325 MG tablet Take 1 tablet by mouth every 6 (six) hours as needed.     phentermine (ADIPEX-P) 37.5 MG tablet Take 1 tablet (37.5 mg total) by mouth daily before breakfast. 30 tablet 0   polyethylene glycol-electrolytes (NULYTELY) 420 g solution Take by mouth.     predniSONE (DELTASONE) 20 MG tablet Take by mouth.     PREMARIN vaginal cream Place vaginally.  tiZANidine (ZANAFLEX) 2 MG tablet  (Patient not taking: Reported on 10/23/2021)     Vitamin D, Ergocalciferol, (DRISDOL) 1.25 MG (50000 UNIT) CAPS capsule Take 1 capsule (50,000 Units total) by mouth every 7 (seven) days. 12 capsule 1   No current facility-administered medications for this visit.    Allergies as of 11/17/2021   (No Known Allergies)    Family History  Problem Relation Age of Onset   Breast cancer Sister    Cancer Sister    Diabetes Sister    Mental illness Sister    Mental illness Brother    Alzheimer's disease Maternal Aunt    Alzheimer's disease Maternal Grandmother    Breast cancer Cousin    Bladder Cancer Cousin    Colon cancer Neg Hx    Colon  polyps Neg Hx    Esophageal cancer Neg Hx    Rectal cancer Neg Hx    Stomach cancer Neg Hx      Physical Exam: General:   Alert,  well-nourished, pleasant and cooperative in NAD Head:  Normocephalic and atraumatic. Eyes:  Sclera clear, no icterus.   Conjunctiva pink. Mouth:  No deformity or lesions.   Neck:  Supple; no masses or thyromegaly. Lungs:  Clear throughout to auscultation.   No wheezes. Heart:  Regular rate and rhythm; no murmurs. Abdomen:  Soft, non-tender, nondistended, normal bowel sounds, no rebound or guarding.  Msk:  Symmetrical. No boney deformities LAD: No inguinal or umbilical LAD Extremities:  No clubbing or edema. Neurologic:  Alert and  oriented x4;  grossly nonfocal Skin:  No obvious rash or bruise. Psych:  Alert and cooperative. Normal mood and affect.     Studies/Results: No results found.    Bond Grieshop L. Tarri Glenn, MD, MPH 11/17/2021, 9:29 AM

## 2021-11-17 NOTE — Progress Notes (Signed)
A and O x3. Report to RN. Tolerated MAC anesthesia well. 

## 2021-11-17 NOTE — Patient Instructions (Signed)
HIGH FIBER DIET.  HANDOUTS ON HIGH FIBER DIET, POLYPS AND DIVERTICULOSIS GIVEN.  GIVEN THESE RESULTS, ALL FIRST DEGREE RELATIVES (BROTHERS, SISTERS, CHILDREN AND PARENTS) SHOULD START COLON CANCER SCREENING AT AGE 65.     YOU HAD AN ENDOSCOPIC PROCEDURE TODAY AT [facility]:   Refer to the procedure report that was given to you for any specific questions about what was found during the examination.  If the procedure report does not answer your questions, please call your gastroenterologist to clarify.  If you requested that your care partner not be given the details of your procedure findings, then the procedure report has been included in a sealed envelope for you to review at your convenience later.  YOU SHOULD EXPECT: Some feelings of bloating in the abdomen. Passage of more gas than usual.  Walking can help get rid of the air that was put into your GI tract during the procedure and reduce the bloating. If you had a lower endoscopy (such as a colonoscopy or flexible sigmoidoscopy) you may notice spotting of blood in your stool or on the toilet paper. If you underwent a bowel prep for your procedure, you may not have a normal bowel movement for a few days.  Please Note:  You might notice some irritation and congestion in your nose or some drainage.  This is from the oxygen used during your procedure.  There is no need for concern and it should clear up in a day or so.  SYMPTOMS TO REPORT IMMEDIATELY:  Following lower endoscopy (colonoscopy or flexible sigmoidoscopy):  Excessive amounts of blood in the stool  Significant tenderness or worsening of abdominal pains  Swelling of the abdomen that is new, acute  Fever of 100F or higher    For urgent or emergent issues, a gastroenterologist can be reached at any hour by calling [phone]. Do not use MyChart messaging for urgent concerns.    DIET:  We do recommend a small meal at first, but then you may proceed to your  regular diet.  Drink plenty of fluids but you should avoid alcoholic beverages for 24 hours.  ACTIVITY:  You should plan to take it easy for the rest of today and you should NOT DRIVE or use heavy machinery until tomorrow (because of the sedation medicines used during the test).    FOLLOW UP: Our staff will call the number listed on your records the next business day following your procedure.  We will call around 7:15- 8:00 am to check on you and address any questions or concerns that you may have regarding the information given to you following your procedure. If we do not reach you, we will leave a message.  If you develop any symptoms (ie: fever, flu-like symptoms, shortness of breath, cough etc.) before then, please call [phone].  If you test positive for Covid 19 in the 2 weeks post procedure, please call and report this information to [place].    If any biopsies were taken you will be contacted by phone or by letter within the next 1-3 weeks.  Please call us at [phone] if you have not heard about the biopsies in 3 weeks.    SIGNATURES/CONFIDENTIALITY: You and/or your care partner have signed paperwork which will be entered into your electronic medical record.  These signatures attest to the fact that that the information above on your After Visit Summary has been reviewed and is understood.  Full responsibility of the confidentiality of this discharge information  lies with you and/or your care-partner.  

## 2021-11-20 ENCOUNTER — Telehealth: Payer: Self-pay

## 2021-11-20 NOTE — Telephone Encounter (Signed)
  Follow up Call-     11/17/2021   10:24 AM  Call back number  Post procedure Call Back phone  # [phone]  Permission to leave phone message Yes     Patient questions:  Do you have a fever, pain , or abdominal swelling? No. Pain Score  0 *  Have you tolerated food without any problems? Yes.    Have you been able to return to your normal activities? Yes.    Do you have any questions about your discharge instructions: Diet   No. Medications  No. Follow up visit  No.  Do you have questions or concerns about your Care? No.  Actions: * If pain score is 4 or above: No action needed, pain <4.

## 2021-11-22 ENCOUNTER — Ambulatory Visit: Payer: Medicare HMO | Admitting: Family Medicine

## 2021-11-23 ENCOUNTER — Ambulatory Visit
Admission: RE | Admit: 2021-11-23 | Discharge: 2021-11-23 | Disposition: A | Discharge status: Home / Self Care | Payer: Medicare HMO | Source: Ambulatory Visit | Attending: Internal Medicine | Admitting: Internal Medicine

## 2021-11-23 DIAGNOSIS — R911 Solitary pulmonary nodule: Secondary | ICD-10-CM

## 2021-12-12 ENCOUNTER — Encounter: Payer: Self-pay | Admitting: Gastroenterology

## 2022-02-02 ENCOUNTER — Ambulatory Visit: Payer: Medicare HMO | Admitting: Podiatry

## 2022-02-27 ENCOUNTER — Ambulatory Visit
Admission: RE | Admit: 2022-02-27 | Discharge: 2022-02-27 | Disposition: A | Discharge status: Home / Self Care | Payer: Medicare HMO | Source: Ambulatory Visit | Attending: Infectious Diseases | Admitting: Infectious Diseases

## 2022-02-27 DIAGNOSIS — Z1231 Encounter for screening mammogram for malignant neoplasm of breast: Secondary | ICD-10-CM

## 2022-03-13 ENCOUNTER — Ambulatory Visit: Payer: Medicare HMO | Admitting: Podiatry

## 2022-06-19 ENCOUNTER — Other Ambulatory Visit: Payer: Self-pay | Admitting: Family Medicine

## 2022-06-19 DIAGNOSIS — Z13820 Encounter for screening for osteoporosis: Secondary | ICD-10-CM

## 2022-06-20 ENCOUNTER — Encounter: Payer: Self-pay | Admitting: Podiatry

## 2022-06-20 ENCOUNTER — Ambulatory Visit (INDEPENDENT_AMBULATORY_CARE_PROVIDER_SITE_OTHER): Payer: 59 | Admitting: Podiatry

## 2022-06-20 DIAGNOSIS — M79674 Pain in right toe(s): Secondary | ICD-10-CM

## 2022-06-20 DIAGNOSIS — M79675 Pain in left toe(s): Secondary | ICD-10-CM

## 2022-06-20 DIAGNOSIS — B351 Tinea unguium: Secondary | ICD-10-CM | POA: Diagnosis not present

## 2022-06-20 NOTE — Progress Notes (Signed)
  Subjective:  Patient ID: Jasmine Estrada, female    DOB: 1955-12-16,  MRN: [id number]  Jasmine Estrada presents to clinic today for painful porokeratotic lesion(s) left lower extremity and painful mycotic toenails that limit ambulation. Painful toenails interfere with ambulation. Aggravating factors include wearing enclosed shoe gear. Pain is relieved with periodic professional debridement. Painful porokeratotic lesions are aggravated when weightbearing with and without shoegear. Pain is relieved with periodic professional debridement.  New problem(s): None.   PCP is Fulp, Cammie, MD , and last visit was one week ago.  No Known Allergies  Review of Systems: Negative except as noted in the HPI.  Objective:  Jasmine Estrada is a pleasant 67 y.o. female obese in NAD. AAO x 3.   Vascular Examination: Vascular status intact b/l with palpable  DP pulses.  Faintly palpable PT pulses. Pedal hair present b/l. CFT immediate b/l. No edema. No pain with calf compression b/l. Skin temperature gradient WNL b/l.   Neurological Examination: Sensation grossly intact b/l with 10 gram monofilament. Vibratory sensation intact b/l.   Dermatological Examination: Pedal skin with normal turgor, texture and tone b/l. Toenails 1-5 b/l thick, discolored, elongated with subungual debris and pain on dorsal palpation.Porokeratotic lesion(s) plantar heel pad of left foot. No erythema, no edema, no drainage, no fluctuance.  Musculoskeletal Examination: Muscle strength 5/5 to b/l LE. 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 Pes planus deformity noted bilateral LE.  Last A1c:       No data to display         Assessment/Plan: No diagnosis found.    -Patient was evaluated and treated. All patient's and/or POA's questions/concerns answered on today's visit. -Medicaid ABN signed for services of paring of corn(s)/callus(es)/porokeratos(es) today. Copy in patient chart. -Patient to continue soft, supportive  shoe gear daily. -Toenails 1-5 b/l were debrided in length and girth with sterile nail nippers and dremel without iatrogenic bleeding.  -Porokeratotic lesion(s) plantar heel pad of left foot pared and enucleated with sterile scalpel blade without incident. Total number of lesions debrided=1. -Patient/POA to call should there be question/concern in the interim.    Return in about 3 months (around 09/20/2022) for RFC .  Felipa Furnace, DPM

## 2022-09-19 ENCOUNTER — Ambulatory Visit (INDEPENDENT_AMBULATORY_CARE_PROVIDER_SITE_OTHER): Payer: 59 | Admitting: Podiatry

## 2022-09-19 ENCOUNTER — Telehealth: Payer: Self-pay | Admitting: Podiatry

## 2022-09-19 VITALS — BP 150/82 | HR 66 | Temp 98.1°F

## 2022-09-19 DIAGNOSIS — M79674 Pain in right toe(s): Secondary | ICD-10-CM

## 2022-09-19 DIAGNOSIS — B351 Tinea unguium: Secondary | ICD-10-CM

## 2022-09-19 DIAGNOSIS — M79675 Pain in left toe(s): Secondary | ICD-10-CM

## 2022-09-19 NOTE — Telephone Encounter (Signed)
Pt left message today at 742am stating she has an appt today at 805 am and her transportation has not shown up. They were to have been there at 715am. She did not want to cxl her appt but if transportation did not show up she has no one to bring her.  I checked the schedule an pt made it for her appt.

## 2022-09-19 NOTE — Progress Notes (Signed)
  Subjective:  Patient ID: Jasmine Estrada, female    DOB: Feb 10, 1956,  MRN: [id number]  Jasmine Estrada presents to clinic today for painful porokeratotic lesion(s) left lower extremity and painful mycotic toenails that limit ambulation. Painful toenails interfere with ambulation. Aggravating factors include wearing enclosed shoe gear. Pain is relieved with periodic professional debridement. Painful porokeratotic lesions are aggravated when weightbearing with and without shoegear. Pain is relieved with periodic professional debridement.  New problem(s): None.   PCP is Fulp, Cammie, MD , and last visit was one week ago.  No Known Allergies  Review of Systems: Negative except as noted in the HPI.  Objective:  Jasmine Estrada is a pleasant 67 y.o. female obese in NAD. AAO x 3.   Vascular Examination: Vascular status intact b/l with palpable  DP pulses.  Faintly palpable PT pulses. Pedal hair present b/l. CFT immediate b/l. No edema. No pain with calf compression b/l. Skin temperature gradient WNL b/l.   Neurological Examination: Sensation grossly intact b/l with 10 gram monofilament. Vibratory sensation intact b/l.   Dermatological Examination: Pedal skin with normal turgor, texture and tone b/l. Toenails 1-5 b/l thick, discolored, elongated with subungual debris and pain on dorsal palpation.Porokeratotic lesion(s) plantar heel pad of left foot. No erythema, no edema, no drainage, no fluctuance.  Musculoskeletal Examination: Muscle strength 5/5 to b/l LE. 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 Pes planus deformity noted bilateral LE.  Last A1c:       No data to display         Assessment/Plan: 1. Pain due to onychomycosis of toenails of both feet       -Patient was evaluated and treated. All patient's and/or POA's questions/concerns answered on today's visit. -Medicaid ABN signed for services of paring of corn(s)/callus(es)/porokeratos(es) today. Copy in patient  chart. -Patient to continue soft, supportive shoe gear daily. -Toenails 1-5 b/l were debrided in length and girth with sterile nail nippers and dremel without iatrogenic bleeding.  -Porokeratotic lesion(s) plantar heel pad of left foot pared and enucleated with sterile scalpel blade without incident. Total number of lesions debrided=1. -Patient/POA to call should there be question/concern in the interim.    No follow-ups on file.  Candelaria Stagers, DPM

## 2022-12-18 ENCOUNTER — Ambulatory Visit: Payer: 59 | Admitting: Podiatry

## 2022-12-26 ENCOUNTER — Ambulatory Visit: Payer: 59 | Admitting: Podiatry

## 2023-01-04 ENCOUNTER — Ambulatory Visit (INDEPENDENT_AMBULATORY_CARE_PROVIDER_SITE_OTHER): Payer: 59 | Admitting: Podiatry

## 2023-01-04 DIAGNOSIS — B351 Tinea unguium: Secondary | ICD-10-CM | POA: Diagnosis not present

## 2023-01-04 DIAGNOSIS — M79674 Pain in right toe(s): Secondary | ICD-10-CM

## 2023-01-04 DIAGNOSIS — M79675 Pain in left toe(s): Secondary | ICD-10-CM

## 2023-01-04 NOTE — Progress Notes (Signed)
  Subjective:  Patient ID: Jasmine Estrada, female    DOB: Feb 10, 1956,  MRN: [id number]  LES PASTRANA presents to clinic today for painful porokeratotic lesion(s) left lower extremity and painful mycotic toenails that limit ambulation. Painful toenails interfere with ambulation. Aggravating factors include wearing enclosed shoe gear. Pain is relieved with periodic professional debridement. Painful porokeratotic lesions are aggravated when weightbearing with and without shoegear. Pain is relieved with periodic professional debridement.  New problem(s): None.   PCP is Fulp, Cammie, MD , and last visit was one week ago.  No Known Allergies  Review of Systems: Negative except as noted in the HPI.  Objective:  Jasmine Estrada is a pleasant 67 y.o. female obese in NAD. AAO x 3.   Vascular Examination: Vascular status intact b/l with palpable  DP pulses.  Faintly palpable PT pulses. Pedal hair present b/l. CFT immediate b/l. No edema. No pain with calf compression b/l. Skin temperature gradient WNL b/l.   Neurological Examination: Sensation grossly intact b/l with 10 gram monofilament. Vibratory sensation intact b/l.   Dermatological Examination: Pedal skin with normal turgor, texture and tone b/l. Toenails 1-5 b/l thick, discolored, elongated with subungual debris and pain on dorsal palpation.Porokeratotic lesion(s) plantar heel pad of left foot. No erythema, no edema, no drainage, no fluctuance.  Musculoskeletal Examination: Muscle strength 5/5 to b/l LE. 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 Pes planus deformity noted bilateral LE.  Last A1c:       No data to display         Assessment/Plan: 1. Pain due to onychomycosis of toenails of both feet       -Patient was evaluated and treated. All patient's and/or POA's questions/concerns answered on today's visit. -Medicaid ABN signed for services of paring of corn(s)/callus(es)/porokeratos(es) today. Copy in patient  chart. -Patient to continue soft, supportive shoe gear daily. -Toenails 1-5 b/l were debrided in length and girth with sterile nail nippers and dremel without iatrogenic bleeding.  -Porokeratotic lesion(s) plantar heel pad of left foot pared and enucleated with sterile scalpel blade without incident. Total number of lesions debrided=1. -Patient/POA to call should there be question/concern in the interim.    No follow-ups on file.  Candelaria Stagers, DPM

## 2023-01-16 ENCOUNTER — Ambulatory Visit: Payer: 59 | Admitting: Podiatry

## 2023-01-28 ENCOUNTER — Other Ambulatory Visit: Payer: Self-pay | Admitting: Family Medicine

## 2023-01-28 DIAGNOSIS — Z1231 Encounter for screening mammogram for malignant neoplasm of breast: Secondary | ICD-10-CM

## 2023-01-28 DIAGNOSIS — Z13820 Encounter for screening for osteoporosis: Secondary | ICD-10-CM

## 2023-03-01 ENCOUNTER — Ambulatory Visit: Payer: Medicare HMO

## 2023-03-05 ENCOUNTER — Ambulatory Visit: Payer: Medicare HMO

## 2023-04-02 ENCOUNTER — Ambulatory Visit
Admission: RE | Admit: 2023-04-02 | Discharge: 2023-04-02 | Disposition: A | Discharge status: Home / Self Care | Payer: 59 | Source: Ambulatory Visit | Attending: Family Medicine

## 2023-04-02 DIAGNOSIS — Z1231 Encounter for screening mammogram for malignant neoplasm of breast: Secondary | ICD-10-CM

## 2023-04-05 ENCOUNTER — Ambulatory Visit (INDEPENDENT_AMBULATORY_CARE_PROVIDER_SITE_OTHER): Payer: 59 | Admitting: Podiatry

## 2023-04-05 ENCOUNTER — Ambulatory Visit: Payer: 59 | Admitting: Podiatry

## 2023-04-05 DIAGNOSIS — B351 Tinea unguium: Secondary | ICD-10-CM

## 2023-04-05 DIAGNOSIS — M79674 Pain in right toe(s): Secondary | ICD-10-CM

## 2023-04-05 DIAGNOSIS — M79675 Pain in left toe(s): Secondary | ICD-10-CM

## 2023-04-05 NOTE — Progress Notes (Signed)
  Subjective:  Patient ID: Jasmine Estrada, female    DOB: 09/07/1955,  MRN: [id number]  [name] presents to clinic today for painful porokeratotic lesion(s) left lower extremity and painful mycotic toenails that limit ambulation. Painful toenails interfere with ambulation. Aggravating factors include wearing enclosed shoe gear. Pain is relieved with periodic professional debridement. Painful porokeratotic lesions are aggravated when weightbearing with and without shoegear. Pain is relieved with periodic professional debridement.  New problem(s): None.   PCP is Fulp, Cammie, MD , and last visit was one week ago.  No Known Allergies  Review of Systems: Negative except as noted in the HPI.  Objective:  [name] is a pleasant 67 y.o. female obese in NAD. AAO x 3.   Vascular Examination: Vascular status intact b/l with palpable  DP pulses.  Faintly palpable PT pulses. Pedal hair present b/l. CFT immediate b/l. No edema. No pain with calf compression b/l. Skin temperature gradient WNL b/l.   Neurological Examination: Sensation grossly intact b/l with 10 gram monofilament. Vibratory sensation intact b/l.   Dermatological Examination: Pedal skin with normal turgor, texture and tone b/l. Toenails 1-5 b/l thick, discolored, elongated with subungual debris and pain on dorsal palpation.Porokeratotic lesion(s) plantar heel pad of left foot. No erythema, no edema, no drainage, no fluctuance.  Musculoskeletal Examination: Muscle strength 5/5 to b/l LE. 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 Pes planus deformity noted bilateral LE.  Last A1c:       No data to display         Assessment/Plan: No diagnosis found.      -Patient was evaluated and treated. All patient's and/or POA's questions/concerns answered on today's visit. -Medicaid ABN signed for services of paring of corn(s)/callus(es)/porokeratos(es) today. Copy in patient chart. -Patient to continue soft, supportive  shoe gear daily. -Toenails 1-5 b/l were debrided in length and girth with sterile nail nippers and dremel without iatrogenic bleeding.  -Porokeratotic lesion(s) plantar heel pad of left foot pared and enucleated with sterile scalpel blade without incident. Total number of lesions debrided=1. -Patient/POA to call should there be question/concern in the interim.    No follow-ups on file.  Candelaria Stagers, DPM

## 2023-07-05 ENCOUNTER — Ambulatory Visit (INDEPENDENT_AMBULATORY_CARE_PROVIDER_SITE_OTHER): Payer: 59 | Admitting: Podiatry

## 2023-07-05 ENCOUNTER — Encounter: Payer: Self-pay | Admitting: Podiatry

## 2023-07-05 DIAGNOSIS — M79674 Pain in right toe(s): Secondary | ICD-10-CM

## 2023-07-05 DIAGNOSIS — M79675 Pain in left toe(s): Secondary | ICD-10-CM | POA: Diagnosis not present

## 2023-07-05 DIAGNOSIS — B351 Tinea unguium: Secondary | ICD-10-CM

## 2023-07-05 NOTE — Progress Notes (Signed)
  Subjective:  Patient ID: Jasmine Estrada, female    DOB: 02-Mar-1956,  MRN: [id number]  Jasmine Estrada presents to clinic today for painful porokeratotic lesion(s) left lower extremity and painful mycotic toenails that limit ambulation. Painful toenails interfere with ambulation. Aggravating factors include wearing enclosed shoe gear. Pain is relieved with periodic professional debridement. Painful porokeratotic lesions are aggravated when weightbearing with and without shoegear. Pain is relieved with periodic professional debridement.  New problem(s): None.   PCP is Lourena Simmonds, Donald Pore, MD , and last visit was one week ago.  No Known Allergies  Review of Systems: Negative except as noted in the HPI.  Objective:  Jasmine Estrada is a pleasant 68 y.o. female obese in NAD. AAO x 3.   Vascular Examination: Vascular status intact b/l with palpable  DP pulses.  Faintly palpable PT pulses. Pedal hair present b/l. CFT immediate b/l. No edema. No pain with calf compression b/l. Skin temperature gradient WNL b/l.   Neurological Examination: Sensation grossly intact b/l with 10 gram monofilament. Vibratory sensation intact b/l.   Dermatological Examination: Pedal skin with normal turgor, texture and tone b/l. Toenails 1-5 b/l thick, discolored, elongated with subungual debris and pain on dorsal palpation.Porokeratotic lesion(s) plantar heel pad of left foot. No erythema, no edema, no drainage, no fluctuance.  Musculoskeletal Examination: Muscle strength 5/5 to b/l LE. 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 Pes planus deformity noted bilateral LE.  Last A1c:       No data to display         Assessment/Plan: No diagnosis found.      -Patient was evaluated and treated. All patient's and/or POA's questions/concerns answered on today's visit. -Medicaid ABN signed for services of paring of corn(s)/callus(es)/porokeratos(es) today. Copy in patient chart. -Patient to continue soft,  supportive shoe gear daily. -Toenails 1-5 b/l were debrided in length and girth with sterile nail nippers and dremel without iatrogenic bleeding.  -Porokeratotic lesion(s) plantar heel pad of left foot pared and enucleated with sterile scalpel blade without incident. Total number of lesions debrided=1. -Patient/POA to call should there be question/concern in the interim.    No follow-ups on file.  Candelaria Stagers, DPM

## 2023-07-28 IMAGING — MG MM DIGITAL SCREENING BILAT W/ TOMO AND CAD
8 series · 8 of 24 positions shown · non-contrast
Comparison: Previous exam(s).

CLINICAL DATA: Screening.

EXAM:
DIGITAL SCREENING BILATERAL MAMMOGRAM WITH TOMOSYNTHESIS AND CAD
TECHNIQUE: Bilateral screening digital craniocaudal and mediolateral oblique
mammograms were obtained. Bilateral screening digital breast
tomosynthesis was performed. The images were evaluated with
computer-aided detection.

[L CC synth-2D]
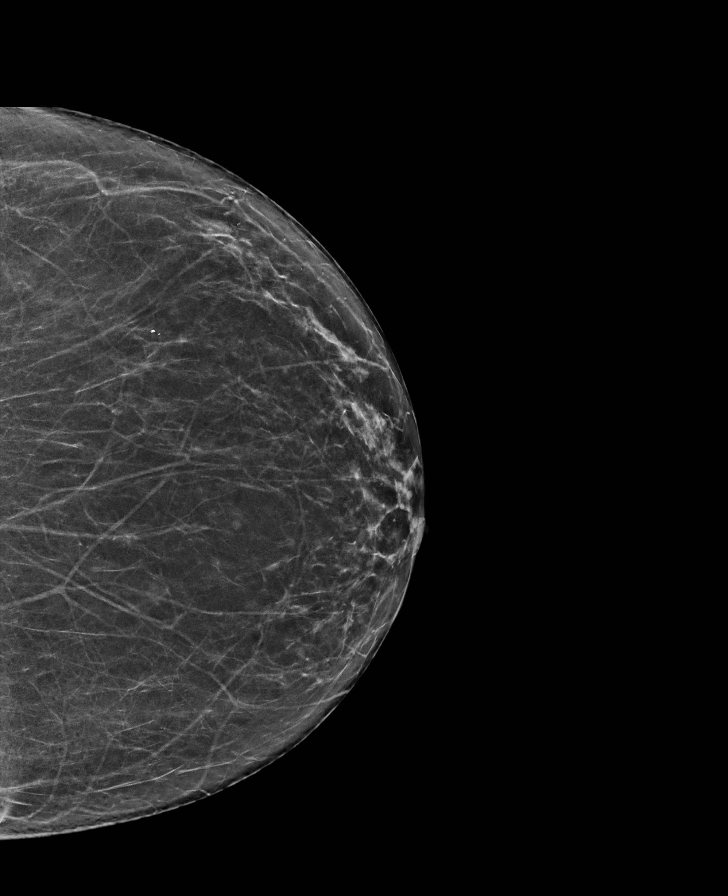

[R MLO synth-2D]
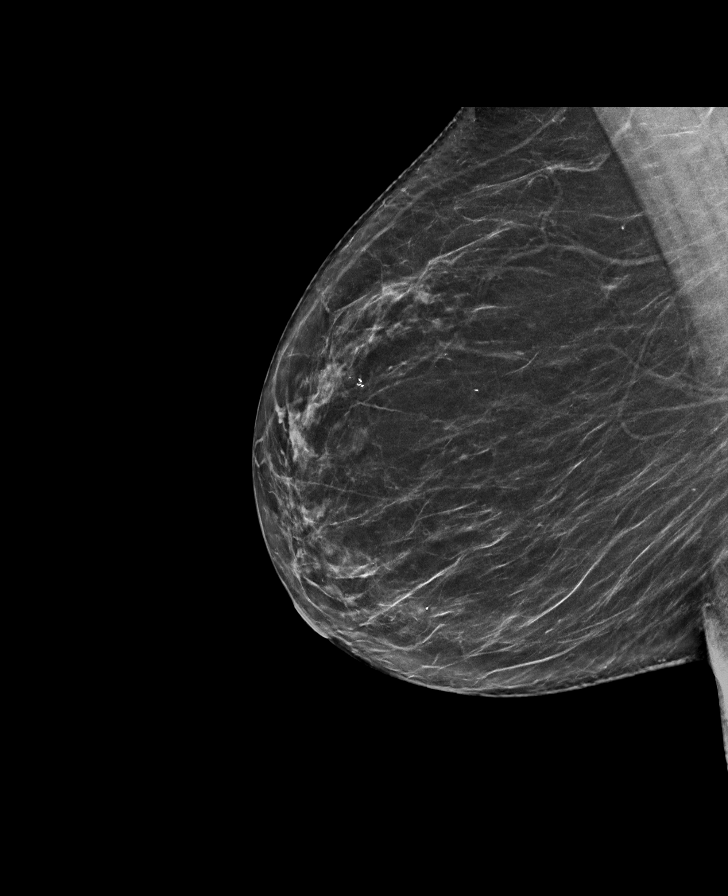

[R CC synth-2D]
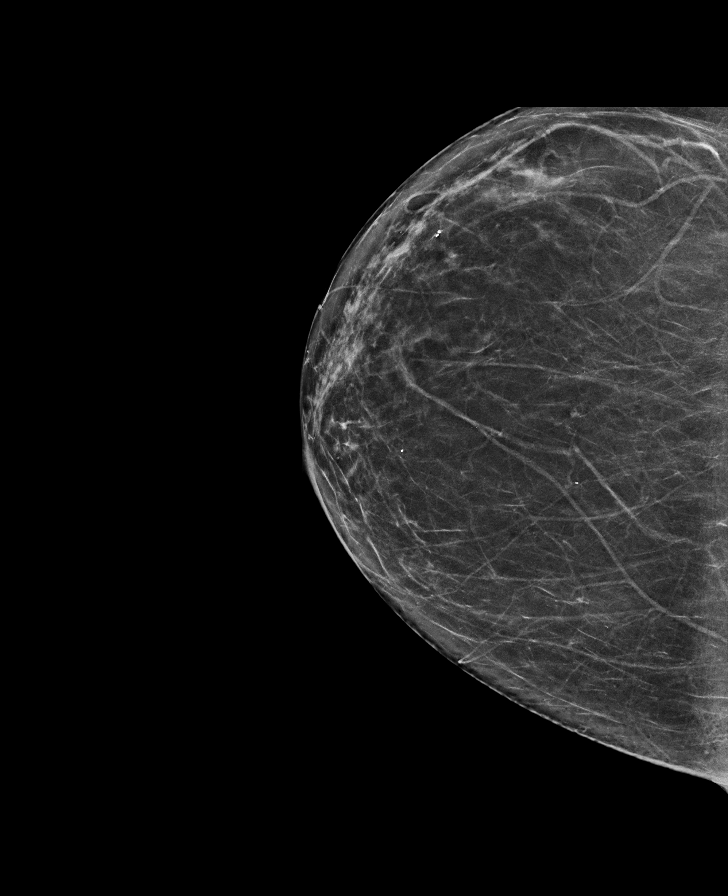

[L MLO synth-2D]
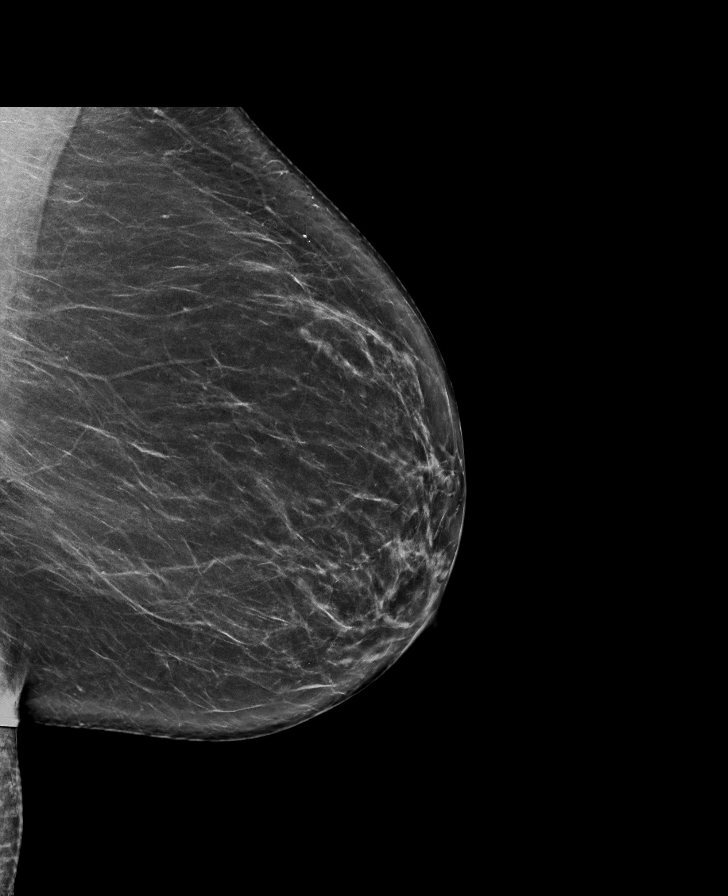

[L CC tomo · tomo slice 33/66.0]
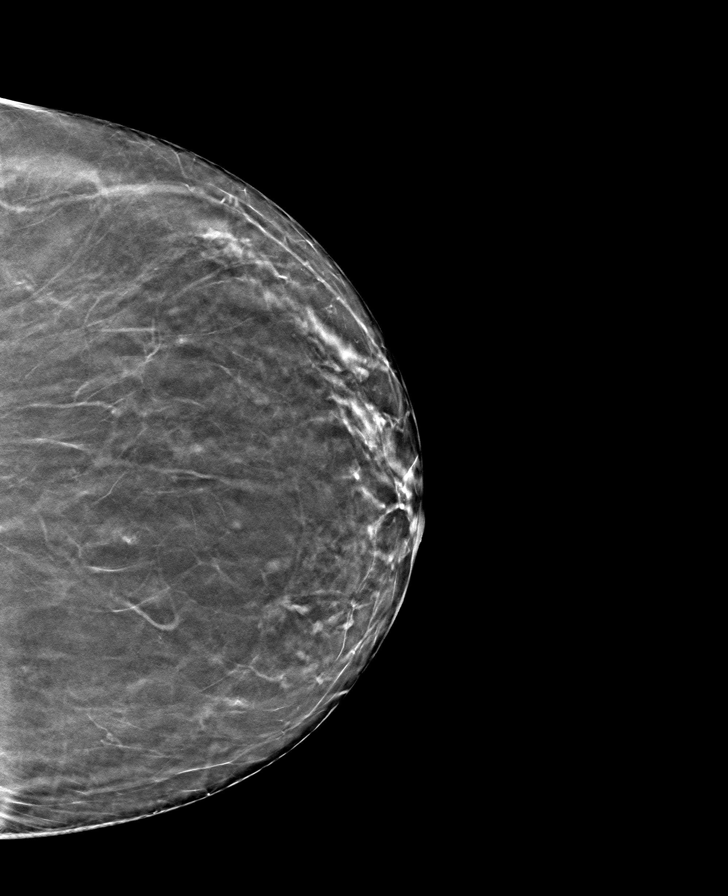

[L MLO tomo · tomo slice 41/80.0]
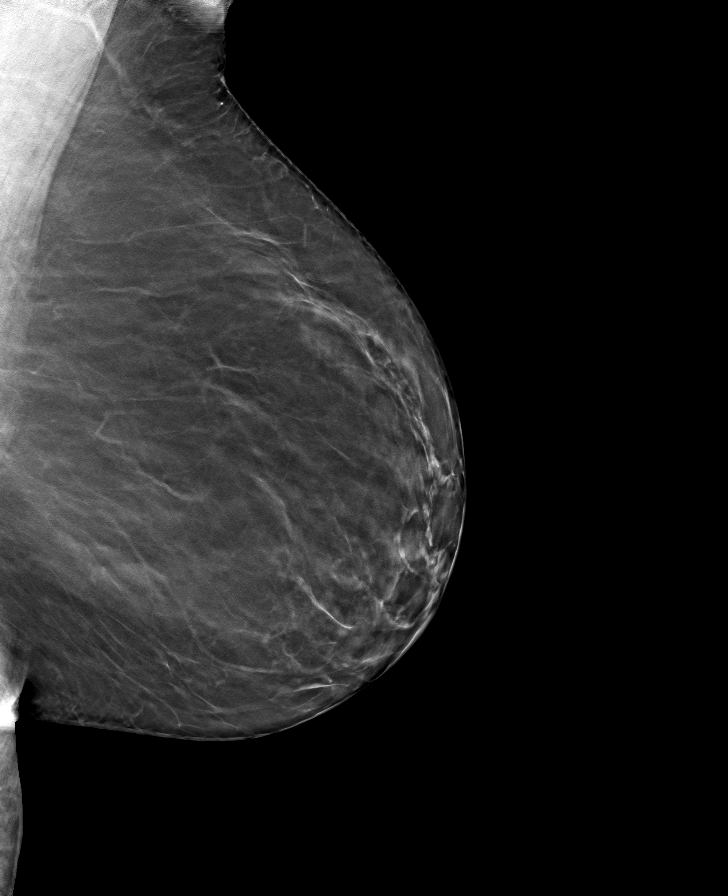

[R MLO tomo · tomo slice 41/81.0]
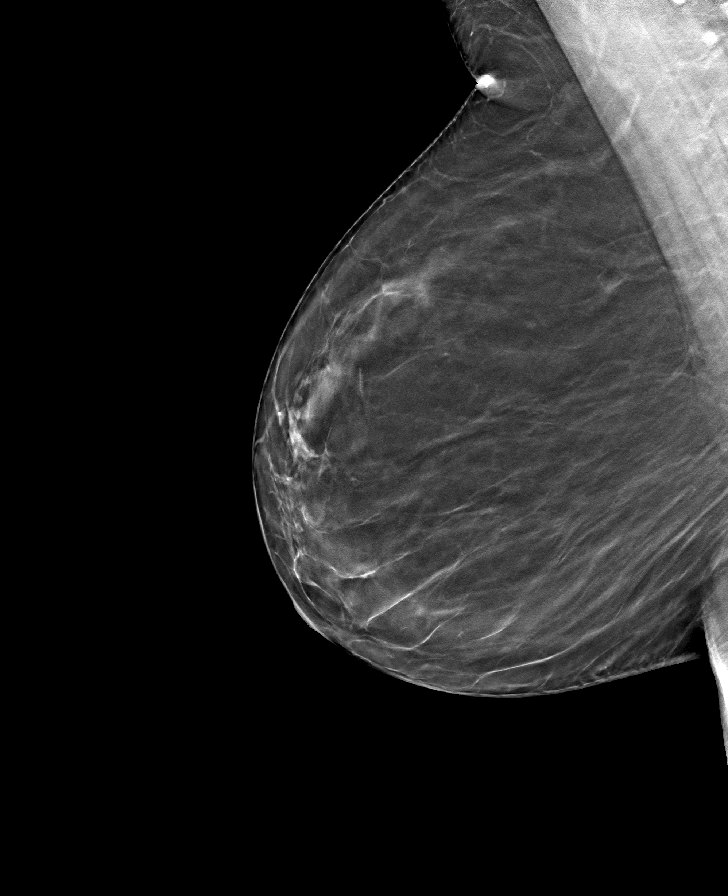

[R CC tomo · tomo slice 35/70.0]
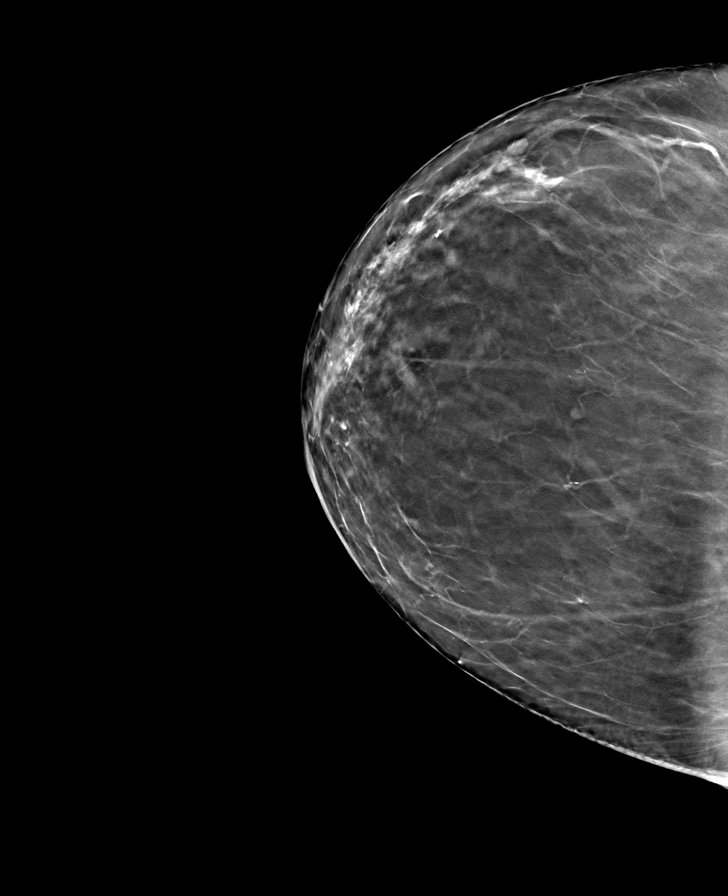

[8 of 24 positions shown; findings below may reference images not displayed]

ACR Breast Density Category b: There are scattered areas of
fibroglandular density.
FINDINGS: There are no findings suspicious for malignancy.
IMPRESSION: No mammographic evidence of malignancy. A result letter of this
screening mammogram will be mailed directly to the patient.

RECOMMENDATION:
Screening mammogram in one year. (Code:51-O-LD2)

BI-RADS CATEGORY  1: Negative.

## 2023-08-26 ENCOUNTER — Ambulatory Visit
Admission: RE | Admit: 2023-08-26 | Discharge: 2023-08-26 | Disposition: A | Discharge status: Home / Self Care | Payer: Medicare HMO | Source: Ambulatory Visit | Attending: Family Medicine | Admitting: Family Medicine

## 2023-08-26 DIAGNOSIS — Z13820 Encounter for screening for osteoporosis: Secondary | ICD-10-CM

## 2023-09-13 ENCOUNTER — Other Ambulatory Visit: Payer: Self-pay | Admitting: Family Medicine

## 2023-09-13 DIAGNOSIS — F17210 Nicotine dependence, cigarettes, uncomplicated: Secondary | ICD-10-CM

## 2023-09-13 DIAGNOSIS — Z122 Encounter for screening for malignant neoplasm of respiratory organs: Secondary | ICD-10-CM

## 2023-09-23 ENCOUNTER — Inpatient Hospital Stay: Admission: RE | Admit: 2023-09-23 | Source: Ambulatory Visit

## 2023-10-01 ENCOUNTER — Ambulatory Visit
Admission: RE | Admit: 2023-10-01 | Discharge: 2023-10-01 | Disposition: A | Discharge status: Home / Self Care | Source: Ambulatory Visit | Attending: Family Medicine | Admitting: Family Medicine

## 2023-10-01 DIAGNOSIS — Z122 Encounter for screening for malignant neoplasm of respiratory organs: Secondary | ICD-10-CM

## 2023-10-01 DIAGNOSIS — F17210 Nicotine dependence, cigarettes, uncomplicated: Secondary | ICD-10-CM

## 2023-10-11 ENCOUNTER — Ambulatory Visit: Admitting: Podiatry

## 2023-11-01 ENCOUNTER — Ambulatory Visit: Admitting: Podiatry

## 2023-11-15 ENCOUNTER — Ambulatory Visit: Admitting: Podiatry

## 2023-11-15 DIAGNOSIS — M79674 Pain in right toe(s): Secondary | ICD-10-CM

## 2023-11-15 DIAGNOSIS — M79675 Pain in left toe(s): Secondary | ICD-10-CM

## 2023-11-15 DIAGNOSIS — B351 Tinea unguium: Secondary | ICD-10-CM | POA: Diagnosis not present

## 2023-11-15 NOTE — Progress Notes (Signed)
  Subjective:  Patient ID: Jasmine Estrada, female    DOB: 02-Mar-1956,  MRN: [id number]  Jasmine Estrada presents to clinic today for painful porokeratotic lesion(s) left lower extremity and painful mycotic toenails that limit ambulation. Painful toenails interfere with ambulation. Aggravating factors include wearing enclosed shoe gear. Pain is relieved with periodic professional debridement. Painful porokeratotic lesions are aggravated when weightbearing with and without shoegear. Pain is relieved with periodic professional debridement.  New problem(s): None.   PCP is Lourena Simmonds, Donald Pore, MD , and last visit was one week ago.  No Known Allergies  Review of Systems: Negative except as noted in the HPI.  Objective:  [name] is a pleasant 68 y.o. female obese in NAD. AAO x 3.   Vascular Examination: Vascular status intact b/l with palpable  DP pulses.  Faintly palpable PT pulses. Pedal hair present b/l. CFT immediate b/l. No edema. No pain with calf compression b/l. Skin temperature gradient WNL b/l.   Neurological Examination: Sensation grossly intact b/l with 10 gram monofilament. Vibratory sensation intact b/l.   Dermatological Examination: Pedal skin with normal turgor, texture and tone b/l. Toenails 1-5 b/l thick, discolored, elongated with subungual debris and pain on dorsal palpation.Porokeratotic lesion(s) plantar heel pad of left foot. No erythema, no edema, no drainage, no fluctuance.  Musculoskeletal Examination: Muscle strength 5/5 to b/l LE. 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 Pes planus deformity noted bilateral LE.  Last A1c:       No data to display         Assessment/Plan: No diagnosis found.      -Patient was evaluated and treated. All patient's and/or POA's questions/concerns answered on today's visit. -Medicaid ABN signed for services of paring of corn(s)/callus(es)/porokeratos(es) today. Copy in patient chart. -Patient to continue soft,  supportive shoe gear daily. -Toenails 1-5 b/l were debrided in length and girth with sterile nail nippers and dremel without iatrogenic bleeding.  -Porokeratotic lesion(s) plantar heel pad of left foot pared and enucleated with sterile scalpel blade without incident. Total number of lesions debrided=1. -Patient/POA to call should there be question/concern in the interim.    No follow-ups on file.  Candelaria Stagers, DPM

## 2023-11-22 ENCOUNTER — Other Ambulatory Visit: Payer: Self-pay | Admitting: Family Medicine

## 2023-11-22 DIAGNOSIS — Z1231 Encounter for screening mammogram for malignant neoplasm of breast: Secondary | ICD-10-CM

## 2024-01-28 ENCOUNTER — Encounter: Payer: Self-pay | Admitting: Cardiology

## 2024-01-28 NOTE — Progress Notes (Unsigned)
 Cardiology Office Note   Date:  01/30/2024   ID:  Estrada, Jasmine May 12, 1955, MRN [id number]  PCP:  Sigrid Deidra Fox, MD  Cardiologist:   None Referring:  Sigrid Deidra Fox, MD  Chief Complaint  Patient presents with   Cardiomyopathy      History of Present Illness: Jasmine Estrada is a 68 y.o. female who presents for evaluation of  an abnormal echo.   . She has coronary calcium  noted on a CT with aortic atherosclerosis.  She reports a distant history of a heart attack that was managed at [facility] years ago.  She has never had a cardiac catheterization or intervention.  She does not recall any other testing.  She was recently noted on an echocardiogram done at [facility] to have an EF of 45%.  It looks like she has had a history of dyslipidemia and tobacco abuse but no other significant risk factors.  She has had no symptoms.  She denies any shortness of breath, PND or orthopnea.  Said no palpitations, presyncope or syncope.  She has had no weight gain or edema.  She lives alone.  He does her chores of daily living which include vacuuming and she does walking.  She has no symptoms related to this.  She does sleep in a recliner just because it is more comfortable.   Past Medical History:  Diagnosis Date   Anxiety    Arthritis    legs   Asthma    prn inhaler   Cataract    removed both eyes   Estrada 07/15/2012   Depression    Essential and other specified forms of tremor 01/26/2013   Full dentures    Mass of knee 07/2012   left   Memory difficulty 01/26/2013   Obesity    Overactive bladder 2019   Sleep apnea    No CPAP   Stroke (HCC)    right sided-weakness   Tremor    right foot and leg   Urinary frequency     Past Surgical History:  Procedure Laterality Date   ACHILLES TENDON LENGTHENING Right 02/25/2015   bladder stimulator     still present but did not help   BOTOX  INJECTION N/A 11/19/2017   Procedure: CYSTOSCOPY BOTOX  INJECTION;  Surgeon:  Jasmine Hamilton, MD;  Location[facility];  Service: Urology;  Laterality: N/A;   CATARACT EXTRACTION Bilateral 2020   IRIDOTOMY / IRIDECTOMY Bilateral    JOINT REPLACEMENT     MASS EXCISION Left 07/21/2012   Procedure: EXCISION OF LARGE MASS LEFT KNEE WITH LIPOSUCTION ASSISTED;  Surgeon: Jasmine Pick, MD;  Location: [facility];  Service: Plastics;  Laterality: Left;   TOTAL KNEE ARTHROPLASTY Right 09/24/2019   Procedure: TOTAL KNEE ARTHROPLASTY;  Surgeon: Jasmine Cough, MD;  Location: [facility];  Service: Orthopedics;  Laterality: Right;    TUBAL LIGATION  1982     Current Outpatient Medications  Medication Sig Dispense Refill   albuterol  (VENTOLIN  HFA) 108 (90 Base) MCG/ACT inhaler Inhale 2 puffs into the lungs every 6 (six) hours as needed for wheezing. 1 each 3   atorvastatin  (LIPITOR) 40 MG tablet Take 1 tablet (40 mg total) by mouth daily. 90 tablet 1   beclomethasone (QVAR) 80 MCG/ACT inhaler Inhale into the lungs.     celecoxib  (CELEBREX ) 200 MG capsule celecoxib  200 mg capsule  TAKE 1 CAPSULE BY MOUTH EVERY DAY FOR OSTEOARTHRITIS 90 capsule 3   EC-RX Testosterone  0.2 % CREA Place onto the skin.     Ergocalciferol  (VITAMIN D2) 10 MCG (400 UNIT) TABS      FLOVENT HFA 220 MCG/ACT inhaler Inhale 2 puffs into the lungs in the morning and at bedtime.      gabapentin (NEURONTIN) 100 MG capsule Take 200 mg by mouth at bedtime.     HYDROcodone -acetaminophen  (NORCO/VICODIN) 5-325 MG tablet Take 1 tablet by mouth every 6 (six) hours as needed.     metoprolol tartrate (LOPRESSOR) 100 MG tablet Take 1 tablet (100 mg total) by mouth once for 1 dose. Take 90-120 minutes prior to scan. 1 tablet 0   phentermine  (ADIPEX-P ) 37.5 MG tablet Take 1 tablet (37.5 mg total) by mouth daily before breakfast. 30 tablet 0   predniSONE  (DELTASONE ) 20 MG tablet Take by mouth.     PREMARIN vaginal cream Place vaginally.     tiZANidine  (ZANAFLEX ) 2 MG tablet      Vitamin  D, Ergocalciferol , (DRISDOL ) 1.25 MG (50000 UNIT) CAPS capsule Take 1 capsule (50,000 Units total) by mouth every 7 (seven) days. 12 capsule 1   No current facility-administered medications for this visit.    Allergies:   Patient has no known allergies.    Social History:  The patient  reports that she has been smoking cigarettes. She has a 13 pack-year smoking history. She has never used smokeless tobacco. She reports that she does not currently use alcohol. She reports that she does not use drugs.   Family History:  The patient's family history includes Alzheimer's disease in her maternal aunt and maternal grandmother; Bladder Cancer in her cousin; Breast cancer in her cousin and sister; Cancer in her sister; Diabetes in her sister; Mental illness in her brother and sister.    ROS:  Please see the history of present illness.   Otherwise, review of systems are positive for none.   All other systems are reviewed and negative.    PHYSICAL EXAM: VS:  BP 115/75 (BP Location: Left Arm, Patient Position: Sitting, Cuff Size: Large)   Pulse 90   Ht 5' 7 (1.702 m)   Wt 222 lb 6.4 oz (100.9 kg)   SpO2 96%   BMI 34.83 kg/m  , BMI Body mass index is 34.83 kg/m. GENERAL:  Well appearing HEENT:  Pupils equal round and reactive, fundi not visualized, oral mucosa unremarkable NECK:  No jugular venous distention, waveform within normal limits, carotid upstroke brisk and symmetric, no bruits, no thyromegaly LYMPHATICS:  No cervical, inguinal adenopathy LUNGS:  Clear to auscultation bilaterally BACK:  No CVA tenderness CHEST:  Unremarkable HEART:  PMI not displaced or sustained,S1 and S2 within normal limits, no S3, no S4, no clicks, no rubs, no murmurs ABD:  Flat, positive bowel sounds normal in frequency in pitch, no bruits, no rebound, no guarding, no midline pulsatile mass, no hepatomegaly, no splenomegaly EXT:  2 plus pulses throughout, no edema, no cyanosis no clubbing SKIN:  No rashes no  nodules NEURO:  Cranial nerves II through XII grossly intact, motor grossly intact throughout [facility]:  Cognitively intact, oriented to person place and time    EKG:  EKG Interpretation Date/Time:  Thursday January 30 2024 08:23:15 EDT Ventricular Rate:  77 PR Interval:  154 QRS Duration:  86 QT Interval:  398 QTC Calculation: 450 R Axis:   -6  Text Interpretation: Normal sinus rhythm Nonspecific T wave abnormality No previous ECGs available Confirmed by Lavona Agent ([id number]) on 01/30/2024 8:26:09 AM  Recent Labs: No results found for requested labs within last 365 days.    Lipid Panel    Component Value Date/Time   CHOL 205 (H) 06/14/2021 1104   TRIG 118 06/14/2021 1104   HDL 55 06/14/2021 1104   CHOLHDL 3.7 06/14/2021 1104   LDLCALC 129 (H) 06/14/2021 1104      Wt Readings from Last 3 Encounters:  01/30/24 222 lb 6.4 oz (100.9 kg)  11/17/21 234 lb (106.1 kg)  10/23/21 219 lb (99.3 kg)      Other studies Reviewed: Additional studies/ records that were reviewed today include: Primary care office records. Review of the above records demonstrates:  Please see elsewhere in the note.     ASSESSMENT AND PLAN:  Cardiomyopathy: The etiology of this is not clear.  There was some suggestion of some mild septal hypertrophy.  Because of her history of a past MI questionably and coronary calcium  I will start with a coronary CTA.  For now her blood pressure is low and she is asymptomatic.  I will likely start her on Entresto when I see her back.  Further testing will be based on these results.  Coronary artery calcium : This will be evaluated as below.  Aortic atherosclerosis:   I am going to pursue aggressive risk reduction.  We talked about tobacco use.  I am to check a lipid profile with a goal LDL at least in the 70s.  Tobacco abuse: She was educated and we will discuss further treatment in the future if she is unable to cut back or quit.  Current medicines are  reviewed at length with the patient today.  The patient does not have concerns regarding medicines.  The following changes have been made:  no change  Labs/ tests ordered today include:   Orders Placed This Encounter  Procedures   CT CORONARY MORPH W/CTA COR W/SCORE W/CA W/CM &/OR WO/CM   Basic Metabolic Panel (BMET)   Lipid panel   ALT   EKG 12-Lead     Disposition:   FU with me in 3 months.   Signed, Lynwood Schilling, MD  01/30/2024 9:06 AM    [facility]

## 2024-01-29 DIAGNOSIS — I700 Atherosclerosis of aorta: Secondary | ICD-10-CM | POA: Insufficient documentation

## 2024-01-29 DIAGNOSIS — R931 Abnormal findings on diagnostic imaging of heart and coronary circulation: Secondary | ICD-10-CM | POA: Insufficient documentation

## 2024-01-30 ENCOUNTER — Encounter: Payer: Self-pay | Admitting: Cardiology

## 2024-01-30 ENCOUNTER — Ambulatory Visit: Attending: Cardiology | Admitting: Cardiology

## 2024-01-30 VITALS — BP 115/75 | HR 90 | Ht 67.0 in | Wt 222.4 lb

## 2024-01-30 DIAGNOSIS — I700 Atherosclerosis of aorta: Secondary | ICD-10-CM

## 2024-01-30 DIAGNOSIS — I429 Cardiomyopathy, unspecified: Secondary | ICD-10-CM | POA: Diagnosis not present

## 2024-01-30 DIAGNOSIS — R931 Abnormal findings on diagnostic imaging of heart and coronary circulation: Secondary | ICD-10-CM

## 2024-01-30 DIAGNOSIS — Z01812 Encounter for preprocedural laboratory examination: Secondary | ICD-10-CM

## 2024-01-30 DIAGNOSIS — Z79899 Other long term (current) drug therapy: Secondary | ICD-10-CM

## 2024-01-30 MED ORDER — METOPROLOL TARTRATE 100 MG PO TABS: 100.0000 mg | ORAL_TABLET | Freq: Once | ORAL | 0 refills | Status: DC

## 2024-01-30 NOTE — Patient Instructions (Signed)
 Medication Instructions:  Your physician recommends that you continue on your current medications as directed. Please refer to the Current Medication list given to you today.  *If you need a refill on your cardiac medications before your next appointment, please call your pharmacy*  Lab Work: Please complete a BMET at our first floor lab before you leave. You do not have to be fasting.  If you have labs (blood work) drawn today and your tests are completely normal, you will receive your results only by: MyChart Message (if you have MyChart) OR A paper copy in the mail If you have any lab test that is abnormal or we need to change your treatment, we will call you to review the results.  Testing/Procedures:   Your cardiac CT will be scheduled at one of the below locations:   [facility] [address] [place], [place] [address] [phone] (Severe contrast allergies only)  OR   [facility] [address] [place], [place] [address] [phone]  OR   [facility] [address] [place], [place] [address] [phone]  OR   [facility] and Vascular Tower [address]  [place], [place] [address] [phone]  OR   [facility] [address] [place], [place] [phone]  If scheduled at [facility], please arrive at the [facility] Entrance (Entrance C2) of [facility] 30 minutes prior to test start time. You can use the FREE valet parking offered at entrance C (encouraged to control the heart rate for the test)  Proceed to the [facility] Radiology Department (first floor) to check-in and test prep.  All radiology patients and guests should use entrance C2 at [facility], accessed from [facility], even though the hospital's physical address listed is [address]  If scheduled at the Heart and Vascular Tower at [address], please  enter the parking lot using the [address] entrance and use the FREE valet service at the patient drop-off area. Enter the building and check-in with registration on the main floor.  If scheduled at [facility], please arrive to the Heart and Vascular Center 15 mins early for check-in and test prep.  There is spacious parking and easy access to the radiology department from the [facility] Heart and Vascular entrance. Please enter here and check-in with the desk attendant.   If scheduled at [facility], please arrive 30 minutes early for check-in and test prep.  Please follow these instructions carefully (unless otherwise directed):  An IV will be required for this test and Nitroglycerin will be given.  Hold all erectile dysfunction medications at least 3 days (72 hrs) prior to test. (Ie viagra, cialis, sildenafil, tadalafil, etc)   On the Night Before the Test: Be sure to Drink plenty of water . Do not consume any caffeinated/decaffeinated beverages or chocolate 12 hours prior to your test. Do not take any antihistamines 12 hours prior to your test.   On the Day of the Test: Drink plenty of water  until 1 hour prior to the test. Do not eat any food 1 hour prior to test. You may take your regular medications prior to the test.  Take a one-time dose of metoprolol (Lopressor) two hours prior to test. If you take Furosemide/Hydrochlorothiazide/Spironolactone/Chlorthalidone, please HOLD on the morning of the test. FEMALES- please wear underwire-free bra if available, avoid dresses & tight clothing  After the Test: Drink plenty of water . After receiving IV contrast, you may experience a mild flushed feeling. This is normal. On occasion, you may experience a mild rash up to 24 hours after the test. This is not dangerous. If this occurs, you can take Benadryl  25 mg, Zyrtec, Claritin, or Allegra and increase your fluid intake. (Patients taking Tikosyn should  avoid Benadryl , and may take Zyrtec, Claritin, or Allegra) If you experience trouble breathing, this can be serious. If it is severe call 911 IMMEDIATELY. If it is mild, please call our office.  We will call to schedule your test 2-4 weeks out understanding that some insurance companies will need an authorization prior to the service being performed.   For more information and frequently asked questions, please visit our website : [email or web address]  For non-scheduling related questions, please contact the cardiac imaging nurse navigator should you have any questions/concerns: Cardiac Imaging Nurse Navigators Direct Office Dial: [phone]   For scheduling needs, including cancellations and rescheduling, please call [place], [phone].   Follow-Up: At [facility], you and your health needs are our priority.  As part of our continuing mission to provide you with exceptional heart care, our providers are all part of one team.  This team includes your primary Cardiologist (physician) and Advanced Practice Providers or APPs (Physician Assistants and Nurse Practitioners) who all work together to provide you with the care you need, when you need it.  Your next appointment:   3 month(s)  Provider:   Dr. DOROTHA Schilling, MD   We recommend signing up for the patient portal called MyChart.  Sign up information is provided on this After Visit Summary.  MyChart is used to connect with patients for Virtual Visits (Telemedicine).  Patients are able to view lab/test results, encounter notes, upcoming appointments, etc.  Non-urgent messages can be sent to your provider as well.   To learn more about what you can do with MyChart, go to [email or web address].

## 2024-01-31 LAB — LIPID PANEL
Chol/HDL Ratio: 2.1 ratio (ref 0.0–4.4)
Cholesterol, Total: 137 mg/dL (ref 100–199)
HDL: 66 mg/dL (ref >39)
LDL Chol Calc (NIH): 56 mg/dL (ref 0–99)
Triglycerides: 74 mg/dL (ref 0–149)
VLDL Cholesterol Cal: 15 mg/dL (ref 5–40)

## 2024-01-31 LAB — ALT: ALT: 14 IU/L (ref 0–32)

## 2024-01-31 LAB — BASIC METABOLIC PANEL WITH GFR
BUN/Creatinine Ratio: 24 (ref 12–28)
BUN: 17 mg/dL (ref 8–27)
CO2: 20 mmol/L (ref 20–29)
Calcium: 10.5 mg/dL — ABNORMAL HIGH (ref 8.7–10.3)
Chloride: 105 mmol/L (ref 96–106)
Creatinine, Ser: 0.71 mg/dL (ref 0.57–1.00)
Glucose: 76 mg/dL (ref 70–99)
Potassium: 4.6 mmol/L (ref 3.5–5.2)
Sodium: 141 mmol/L (ref 134–144)
eGFR: 93 mL/min/1.73 (ref >59)

## 2024-02-02 ENCOUNTER — Ambulatory Visit: Payer: Self-pay | Admitting: Cardiology

## 2024-02-03 NOTE — Telephone Encounter (Signed)
 Patient is returning call.

## 2024-02-10 ENCOUNTER — Ambulatory Visit (HOSPITAL_COMMUNITY)
Admission: RE | Admit: 2024-02-10 | Discharge: 2024-02-10 | Disposition: A | Discharge status: Home / Self Care | Source: Ambulatory Visit | Attending: Cardiology | Admitting: Cardiology

## 2024-02-10 DIAGNOSIS — I2584 Coronary atherosclerosis due to calcified coronary lesion: Secondary | ICD-10-CM | POA: Insufficient documentation

## 2024-02-10 DIAGNOSIS — I429 Cardiomyopathy, unspecified: Secondary | ICD-10-CM | POA: Insufficient documentation

## 2024-02-10 DIAGNOSIS — I2510 Atherosclerotic heart disease of native coronary artery without angina pectoris: Secondary | ICD-10-CM | POA: Diagnosis not present

## 2024-02-10 DIAGNOSIS — R931 Abnormal findings on diagnostic imaging of heart and coronary circulation: Secondary | ICD-10-CM

## 2024-02-10 DIAGNOSIS — Z136 Encounter for screening for cardiovascular disorders: Secondary | ICD-10-CM | POA: Insufficient documentation

## 2024-02-10 MED ORDER — NITROGLYCERIN 0.4 MG SL SUBL
0.8000 mg | SUBLINGUAL_TABLET | Freq: Once | SUBLINGUAL | Status: AC
  Administered 2024-02-10: 0.8 mg via SUBLINGUAL

## 2024-02-10 MED ORDER — IOHEXOL 350 MG/ML SOLN
100.0000 mL | Freq: Once | INTRAVENOUS | Status: AC | PRN
  Administered 2024-02-10: 100 mL via INTRAVENOUS

## 2024-02-13 ENCOUNTER — Telehealth: Payer: Self-pay | Admitting: Cardiology

## 2024-02-13 NOTE — Telephone Encounter (Signed)
 Pt is requesting a callback regarding CT results. Please advise

## 2024-02-14 NOTE — Telephone Encounter (Signed)
 The patient has been notified of the result and verbalized understanding.  All questions (if any) were answered. Aleck LOISE Bill, RN 02/14/2024 12:58 PM

## 2024-02-19 ENCOUNTER — Ambulatory Visit (INDEPENDENT_AMBULATORY_CARE_PROVIDER_SITE_OTHER): Admitting: Podiatry

## 2024-02-19 DIAGNOSIS — M79674 Pain in right toe(s): Secondary | ICD-10-CM

## 2024-02-19 DIAGNOSIS — M79675 Pain in left toe(s): Secondary | ICD-10-CM | POA: Diagnosis not present

## 2024-02-19 DIAGNOSIS — B351 Tinea unguium: Secondary | ICD-10-CM

## 2024-02-19 NOTE — Progress Notes (Signed)
  Subjective:  Patient ID: Jasmine Estrada, female    DOB: [birth year],  MRN: [id number]  Jasmine Estrada presents to clinic today for painful porokeratotic lesion(s) left lower extremity and painful mycotic toenails that limit ambulation. Painful toenails interfere with ambulation. Aggravating factors include wearing enclosed shoe gear. Pain is relieved with periodic professional debridement. Painful porokeratotic lesions are aggravated when weightbearing with and without shoegear. Pain is relieved with periodic professional debridement.  New problem(s): None.   PCP is Paliwal, Himanshu, MD , and last visit was one week ago.  No Known Allergies  Review of Systems: Negative except as noted in the HPI.  Objective:  Jasmine Estrada is a pleasant 68 y.o. female obese in NAD. AAO x 3.   Vascular Examination: Vascular status intact b/l with palpable  DP pulses.  Faintly palpable PT pulses. Pedal hair present b/l. CFT immediate b/l. No edema. No pain with calf compression b/l. Skin temperature gradient WNL b/l.   Neurological Examination: Sensation grossly intact b/l with 10 gram monofilament. Vibratory sensation intact b/l.   Dermatological Examination: Pedal skin with normal turgor, texture and tone b/l. Toenails 1-5 b/l thick, discolored, elongated with subungual debris and pain on dorsal palpation.Porokeratotic lesion(s) plantar heel pad of left foot. No erythema, no edema, no drainage, no fluctuance.  Musculoskeletal Examination: Muscle strength 5/5 to b/l LE. 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 Pes planus deformity noted bilateral LE.  Last A1c:       No data to display         Assessment/Plan: 1. Pain due to onychomycosis of toenails of both feet         -Patient was evaluated and treated. All patient's and/or POA's questions/concerns answered on today's visit. -Medicaid ABN signed for services of paring of corn(s)/callus(es)/porokeratos(es) today. Copy in patient  chart. -Patient to continue soft, supportive shoe gear daily. -Toenails 1-5 b/l were debrided in length and girth with sterile nail nippers and dremel without iatrogenic bleeding.  -Porokeratotic lesion(s) plantar heel pad of left foot pared and enucleated with sterile scalpel blade without incident. Total number of lesions debrided=1. -Patient/POA to call should there be question/concern in the interim.    No follow-ups on file.  Crystol Walpole P Jyden Kromer, DPM

## 2024-04-02 ENCOUNTER — Ambulatory Visit

## 2024-04-07 ENCOUNTER — Ambulatory Visit
Admission: RE | Admit: 2024-04-07 | Discharge: 2024-04-07 | Disposition: A | Discharge status: Home / Self Care | Source: Ambulatory Visit | Attending: Family Medicine | Admitting: Family Medicine

## 2024-04-07 DIAGNOSIS — Z1231 Encounter for screening mammogram for malignant neoplasm of breast: Secondary | ICD-10-CM

## 2024-04-21 NOTE — Progress Notes (Unsigned)
" °  Cardiology Office Note:   Date:  04/22/2024  ID:  Jasmine Estrada, DOB October 14, 1955, MRN [id number] PCP: Haze Kingfisher, MD  [facility] Providers Cardiologist:  None {  History of Present Illness:   Jasmine Estrada is a 69 y.o. female who presents for who I saw previously for evaluation of  an abnormal echo.   She has coronary calcium  noted on a CT with aortic atherosclerosis.  She reports a distant history of a heart attack that was managed at [facility] years ago.  She has never had a cardiac catheterization or intervention.  She does not recall any other testing.  She was noted on an echocardiogram done at [facility] to have an EF of 45%.   After the last visit a coronary CT demonstrated mild coronary plaque.    Since she was seen she denies any other acute complaints.  The patient denies any new symptoms such as chest discomfort, neck or arm discomfort. There has been no new shortness of breath, PND or orthopnea. There have been no reported palpitations, presyncope or syncope.   She is limited by back pain and walks with a walker.  She does sleep in a recliner just because it is more comfortable.   ROS: As stated in the HPI and negative for all other systems.  Studies Reviewed:    EKG:     NA  Risk Assessment/Calculations:              Physical Exam:   VS:  BP 128/78   Pulse 77   Ht 5' 7 (1.702 m)   Wt 225 lb 1.6 oz (102.1 kg)   SpO2 93%   BMI 35.26 kg/m    Wt Readings from Last 3 Encounters:  04/22/24 225 lb 1.6 oz (102.1 kg)  01/30/24 222 lb 6.4 oz (100.9 kg)  11/17/21 234 lb (106.1 kg)     GEN: Well nourished, well developed in no acute distress NECK: No JVD; No carotid bruits CARDIAC: RRR, no murmurs, rubs, gallops RESPIRATORY:  Clear to auscultation without rales, wheezing or rhonchi  ABDOMEN: Soft, non-tender, non-distended EXTREMITIES:  No edema; No deformity   ASSESSMENT AND PLAN:      Cardiomyopathy: She has no symptoms related to this.  She  had no obstructive coronary disease.  She has a nonischemic etiology.  I am going to manage her medically starting Entresto  24-26 twice daily and follow-up with an echocardiogram in the months to come.  Coronary artery calcium :  She has mild plaque.   We will continue with risk reduction.    Aortic atherosclerosis:   We are going to pursue risk reduction.  Her LDL was at 56 with an HDL of 66.  She will continue on the current statin.    Tobacco abuse:   We have discussed the need for complete abstinence and she has been unable to cut back or quit.   Current medicines are reviewed at length with the patient today.  The patient does not have concerns regarding medicines.  The following changes have been made:  no change  Labs/ tests ordered today include:  No orders of the defined types were placed in this encounter.    Disposition:   FU with APP in one month.    Signed, Lynwood Schilling, MD  04/22/2024 4:30 PM    [facility]    "

## 2024-04-22 ENCOUNTER — Encounter: Payer: Self-pay | Admitting: Cardiology

## 2024-04-22 ENCOUNTER — Other Ambulatory Visit (HOSPITAL_COMMUNITY): Payer: Self-pay

## 2024-04-22 ENCOUNTER — Ambulatory Visit: Attending: Cardiovascular Disease | Admitting: Cardiology

## 2024-04-22 VITALS — BP 128/78 | HR 77 | Ht 67.0 in | Wt 225.1 lb

## 2024-04-22 DIAGNOSIS — I700 Atherosclerosis of aorta: Secondary | ICD-10-CM | POA: Diagnosis not present

## 2024-04-22 DIAGNOSIS — Z720 Tobacco use: Secondary | ICD-10-CM

## 2024-04-22 DIAGNOSIS — I429 Cardiomyopathy, unspecified: Secondary | ICD-10-CM

## 2024-04-22 DIAGNOSIS — R931 Abnormal findings on diagnostic imaging of heart and coronary circulation: Secondary | ICD-10-CM | POA: Diagnosis not present

## 2024-04-22 MED ORDER — SACUBITRIL-VALSARTAN 24-26 MG PO TABS: 1.0000 | ORAL_TABLET | Freq: Two times a day (BID) | ORAL | 3 refills | Status: AC

## 2024-04-22 NOTE — Patient Instructions (Signed)
 Medication Instructions:  Start Entresto  24-26 mg twice daily *If you need a refill on your cardiac medications before your next appointment, please call your pharmacy*  Lab Work: NONE If you have labs (blood work) drawn today and your tests are completely normal, you will receive your results only by: MyChart Message (if you have MyChart) OR A paper copy in the mail If you have any lab test that is abnormal or we need to change your treatment, we will call you to review the results.  Testing/Procedures: NONE  Follow-Up: At [facility], you and your health needs are our priority.  As part of our continuing mission to provide you with exceptional heart care, our providers are all part of one team.  This team includes your primary Cardiologist (physician) and Advanced Practice Providers or APPs (Physician Assistants and Nurse Practitioners) who all work together to provide you with the care you need, when you need it.  Your next appointment:   1 month  Provider:   One of our Advanced Practice Providers (APPs): Morse Clause, PA-C  Lamarr Satterfield, NP Miriam Shams, NP  Olivia Pavy, PA-C Josefa Beauvais, NP  Leontine Salen, PA-C Orren Fabry, PA-C  Hao Meng, PA-C Ernest Dick, NP  Damien Braver, NP Jon Hails, PA-C  Waddell Donath, PA-C    Dayna Dunn, PA-C  Scott Weaver, PA-C Lum Louis, NP Katlyn West, NP Callie Goodrich, PA-C  Xika Zhao, NP Sheng Haley, PA-C    Kathleen Johnson, PA-C   We recommend signing up for the patient portal called MyChart.  Sign up information is provided on this After Visit Summary.  MyChart is used to connect with patients for Virtual Visits (Telemedicine).  Patients are able to view lab/test results, encounter notes, upcoming appointments, etc.  Non-urgent messages can be sent to your provider as well.   To learn more about what you can do with MyChart, go to [email or web address].

## 2024-05-01 ENCOUNTER — Ambulatory Visit: Admitting: Cardiology

## 2024-05-22 ENCOUNTER — Ambulatory Visit: Admitting: Podiatry

## 2024-05-29 ENCOUNTER — Ambulatory Visit: Admitting: Podiatry

## 2024-06-02 ENCOUNTER — Ambulatory Visit: Admitting: Physician Assistant
